# Patient Record
Sex: Female | Born: 1950 | Race: White | Hispanic: No | Marital: Married | Smoking: Never smoker
Health system: Southern US, Community
[De-identification: ages and names within clinical notes are randomized; demographics above are authoritative.]

## PROBLEM LIST (undated history)

## (undated) DIAGNOSIS — K760 Fatty (change of) liver, not elsewhere classified: Secondary | ICD-10-CM

## (undated) DIAGNOSIS — M5030 Other cervical disc degeneration, unspecified cervical region: Secondary | ICD-10-CM

## (undated) DIAGNOSIS — E079 Disorder of thyroid, unspecified: Secondary | ICD-10-CM

## (undated) DIAGNOSIS — F331 Major depressive disorder, recurrent, moderate: Secondary | ICD-10-CM

## (undated) DIAGNOSIS — IMO0002 Reserved for concepts with insufficient information to code with codable children: Secondary | ICD-10-CM

## (undated) DIAGNOSIS — K317 Polyp of stomach and duodenum: Secondary | ICD-10-CM

## (undated) DIAGNOSIS — F329 Major depressive disorder, single episode, unspecified: Secondary | ICD-10-CM

## (undated) DIAGNOSIS — G43909 Migraine, unspecified, not intractable, without status migrainosus: Secondary | ICD-10-CM

## (undated) DIAGNOSIS — F32A Depression, unspecified: Secondary | ICD-10-CM

## (undated) DIAGNOSIS — E559 Vitamin D deficiency, unspecified: Secondary | ICD-10-CM

## (undated) DIAGNOSIS — N200 Calculus of kidney: Secondary | ICD-10-CM

## (undated) DIAGNOSIS — K279 Peptic ulcer, site unspecified, unspecified as acute or chronic, without hemorrhage or perforation: Secondary | ICD-10-CM

## (undated) DIAGNOSIS — N390 Urinary tract infection, site not specified: Secondary | ICD-10-CM

## (undated) DIAGNOSIS — E890 Postprocedural hypothyroidism: Secondary | ICD-10-CM

## (undated) DIAGNOSIS — I10 Essential (primary) hypertension: Secondary | ICD-10-CM

## (undated) DIAGNOSIS — K219 Gastro-esophageal reflux disease without esophagitis: Secondary | ICD-10-CM

## (undated) DIAGNOSIS — R7989 Other specified abnormal findings of blood chemistry: Secondary | ICD-10-CM

## (undated) DIAGNOSIS — R079 Chest pain, unspecified: Secondary | ICD-10-CM

## (undated) DIAGNOSIS — F419 Anxiety disorder, unspecified: Secondary | ICD-10-CM

## (undated) DIAGNOSIS — J301 Allergic rhinitis due to pollen: Secondary | ICD-10-CM

## (undated) DIAGNOSIS — Z90710 Acquired absence of both cervix and uterus: Secondary | ICD-10-CM

## (undated) DIAGNOSIS — M8580 Other specified disorders of bone density and structure, unspecified site: Secondary | ICD-10-CM

## (undated) DIAGNOSIS — Z780 Asymptomatic menopausal state: Secondary | ICD-10-CM

## (undated) DIAGNOSIS — F411 Generalized anxiety disorder: Secondary | ICD-10-CM

## (undated) HISTORY — DX: Major depressive disorder, single episode, unspecified: F32.9

## (undated) HISTORY — DX: Other specified abnormal findings of blood chemistry: R79.89

## (undated) HISTORY — DX: Major depressive disorder, recurrent, moderate: F33.1

## (undated) HISTORY — DX: Vitamin D deficiency, unspecified: E55.9

## (undated) HISTORY — DX: Anxiety disorder, unspecified: F41.9

## (undated) HISTORY — DX: Calculus of kidney: N20.0

## (undated) HISTORY — DX: Allergic rhinitis due to pollen: J30.1

## (undated) HISTORY — DX: Fatty (change of) liver, not elsewhere classified: K76.0

## (undated) HISTORY — DX: Asymptomatic menopausal state: Z78.0

## (undated) HISTORY — DX: Chest pain, unspecified: R07.9

## (undated) HISTORY — DX: Acquired absence of both cervix and uterus: Z90.710

## (undated) HISTORY — DX: Reserved for concepts with insufficient information to code with codable children: IMO0002

## (undated) HISTORY — DX: Generalized anxiety disorder: F41.1

## (undated) HISTORY — DX: Postprocedural hypothyroidism: E89.0

## (undated) HISTORY — DX: Depression, unspecified: F32.A

## (undated) HISTORY — DX: Urinary tract infection, site not specified: N39.0

## (undated) HISTORY — DX: Migraine, unspecified, not intractable, without status migrainosus: G43.909

## (undated) HISTORY — PX: LITHOTRIPSY: SUR834

## (undated) HISTORY — DX: Other specified disorders of bone density and structure, unspecified site: M85.80

## (undated) HISTORY — DX: Other cervical disc degeneration, unspecified cervical region: M50.30

## (undated) HISTORY — DX: Peptic ulcer, site unspecified, unspecified as acute or chronic, without hemorrhage or perforation: K27.9

## (undated) HISTORY — PX: COSMETIC SURGERY: SHX468

## (undated) HISTORY — DX: Essential (primary) hypertension: I10

## (undated) HISTORY — DX: Disorder of thyroid, unspecified: E07.9

## (undated) HISTORY — DX: Polyp of stomach and duodenum: K31.7

## (undated) HISTORY — DX: Gastro-esophageal reflux disease without esophagitis: K21.9

---

## 1999-08-07 ENCOUNTER — Emergency Department (HOSPITAL_COMMUNITY): Admission: EM | Admit: 1999-08-07 | Discharge: 1999-08-07 | Payer: Self-pay

## 1999-08-09 ENCOUNTER — Emergency Department (HOSPITAL_COMMUNITY): Admission: EM | Admit: 1999-08-09 | Discharge: 1999-08-09 | Payer: Self-pay | Admitting: Emergency Medicine

## 2001-09-08 HISTORY — PX: ABDOMINAL HYSTERECTOMY: SHX81

## 2001-10-01 ENCOUNTER — Inpatient Hospital Stay (HOSPITAL_COMMUNITY): Admission: RE | Admit: 2001-10-01 | Discharge: 2001-10-03 | Payer: Self-pay | Admitting: Obstetrics and Gynecology

## 2001-10-01 ENCOUNTER — Encounter (INDEPENDENT_AMBULATORY_CARE_PROVIDER_SITE_OTHER): Payer: Self-pay | Admitting: Specialist

## 2002-04-04 ENCOUNTER — Encounter: Payer: Self-pay | Admitting: Urology

## 2002-04-04 ENCOUNTER — Ambulatory Visit (HOSPITAL_BASED_OUTPATIENT_CLINIC_OR_DEPARTMENT_OTHER): Admission: RE | Admit: 2002-04-04 | Discharge: 2002-04-04 | Payer: Self-pay | Admitting: Urology

## 2004-04-22 ENCOUNTER — Ambulatory Visit (HOSPITAL_COMMUNITY): Admission: RE | Admit: 2004-04-22 | Discharge: 2004-04-22 | Payer: Self-pay | Admitting: Gastroenterology

## 2005-11-08 HISTORY — PX: CHOLECYSTECTOMY: SHX55

## 2007-06-12 ENCOUNTER — Other Ambulatory Visit: Admission: RE | Admit: 2007-06-12 | Discharge: 2007-06-12 | Payer: Self-pay | Admitting: Family Medicine

## 2007-08-28 ENCOUNTER — Encounter: Admission: RE | Admit: 2007-08-28 | Discharge: 2007-08-28 | Payer: Self-pay | Admitting: Otolaryngology

## 2007-09-09 HISTORY — PX: BREAST SURGERY: SHX581

## 2007-12-17 ENCOUNTER — Encounter: Admission: RE | Admit: 2007-12-17 | Discharge: 2007-12-17 | Payer: Self-pay

## 2007-12-26 ENCOUNTER — Encounter (INDEPENDENT_AMBULATORY_CARE_PROVIDER_SITE_OTHER): Payer: Self-pay | Admitting: Interventional Radiology

## 2007-12-26 ENCOUNTER — Other Ambulatory Visit: Admission: RE | Admit: 2007-12-26 | Discharge: 2007-12-26 | Payer: Self-pay | Admitting: Interventional Radiology

## 2007-12-26 ENCOUNTER — Encounter: Admission: RE | Admit: 2007-12-26 | Discharge: 2007-12-26 | Payer: Self-pay | Admitting: Specialist

## 2008-08-27 ENCOUNTER — Ambulatory Visit: Payer: Self-pay | Admitting: Diagnostic Radiology

## 2008-08-27 ENCOUNTER — Emergency Department (HOSPITAL_BASED_OUTPATIENT_CLINIC_OR_DEPARTMENT_OTHER): Admission: EM | Admit: 2008-08-27 | Discharge: 2008-08-27 | Payer: Self-pay | Admitting: Emergency Medicine

## 2009-07-02 IMAGING — CT CT PARANASAL SINUSES LIMITED
1 series · 16 of 21 positions shown, 20 images · IV contrast (agent unspecified)
Comparison: none

CLINICAL DATA: Sinusitis.
 LIMITED CT OF THE PARANASAL SINUSES WITHOUT CONTRAST:
TECHNIQUE: Limited coronal CT images were obtained through the paranasal sinuses without intravenous contrast.
 Only mild mucosal thickening is noted in the left frontal sinus extending to the posterior ethmoid air cells. However, no air fluid level is seen.  The nasal turbinates on the left are slightly more prominent than those on the right and do compromise the left nasal airway slightly.  No bony abnormality is seen.  Multiple artifacts are created by dental fillings.

[Series 2: limited sinus prone · axial · 0.33mm/px · z∈[+14,+104]mm · 16 of 21 slices shown, 20 images]
[im 2/21  brain]
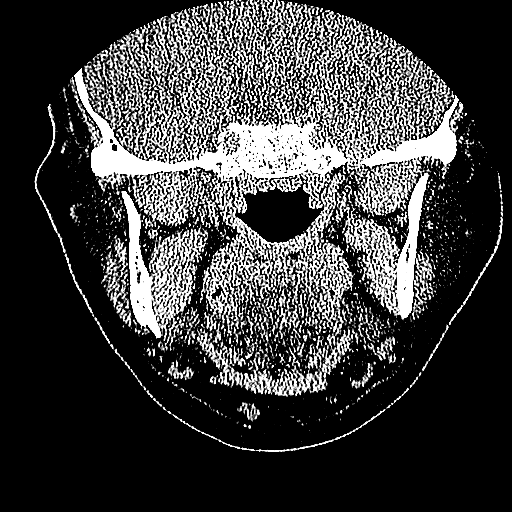
[im 2/21  bone]
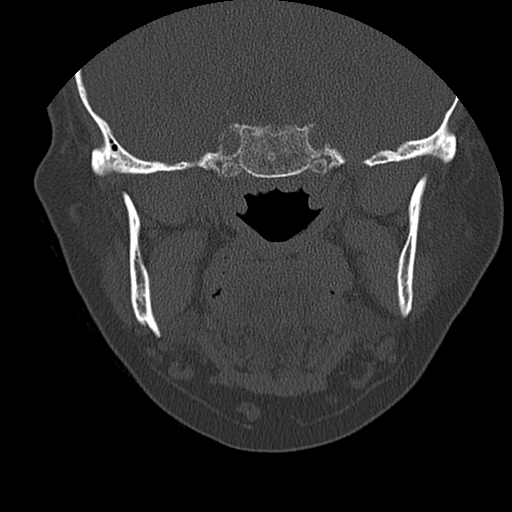
[im 3/21  bone]
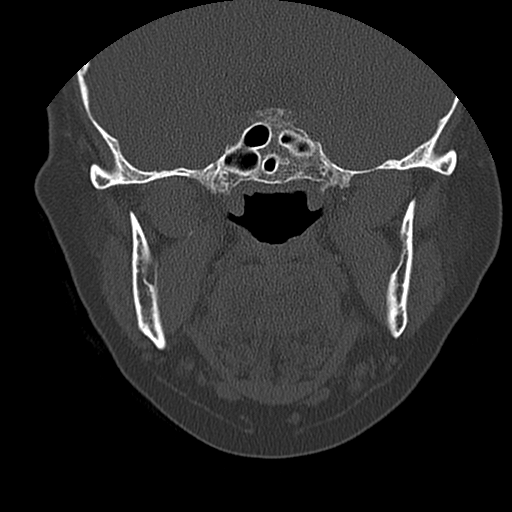
[im 4/21  bone]
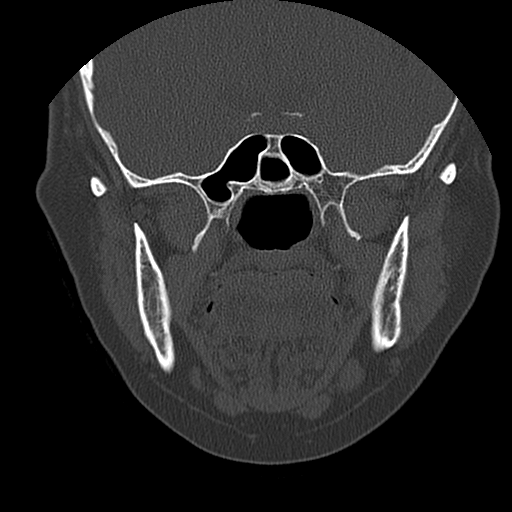
[im 6/21  bone]
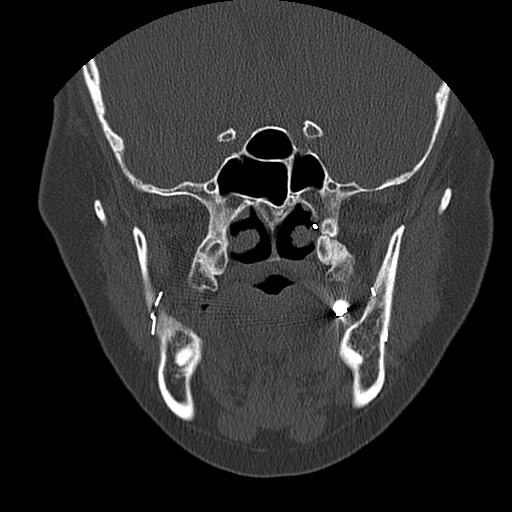
[im 7/21  brain]
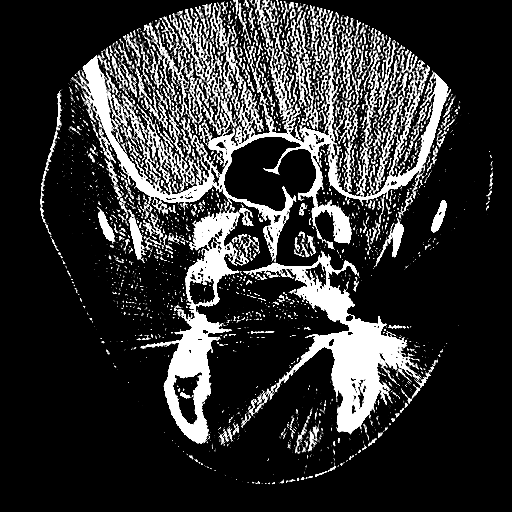
[im 7/21  bone]
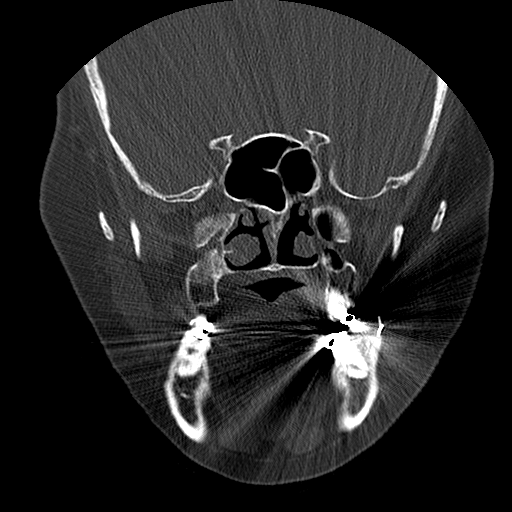
[im 8/21  bone]
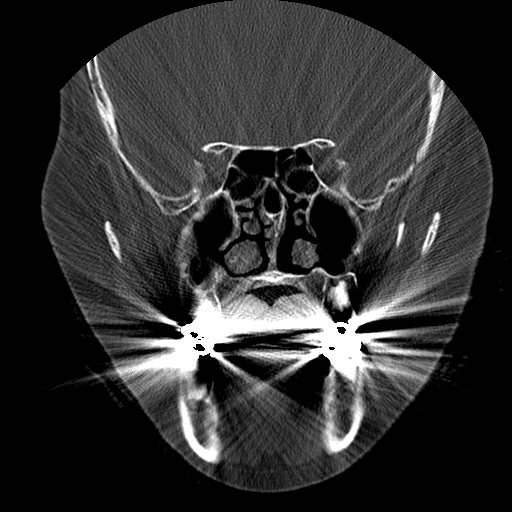
[im 9/21  bone]
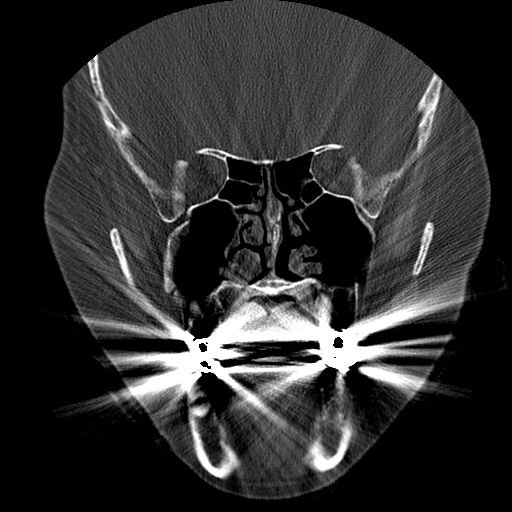
[im 10/21  bone]
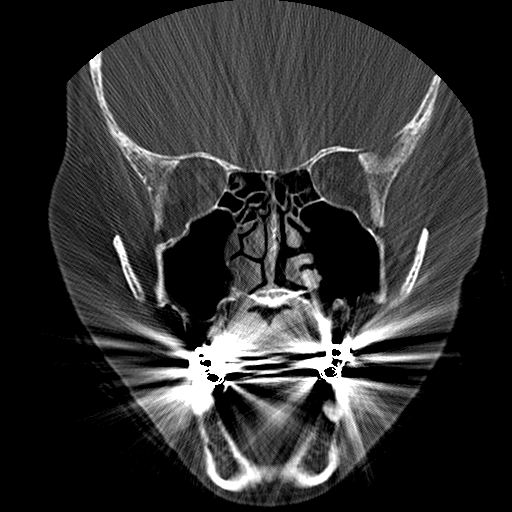
[im 12/21  brain]
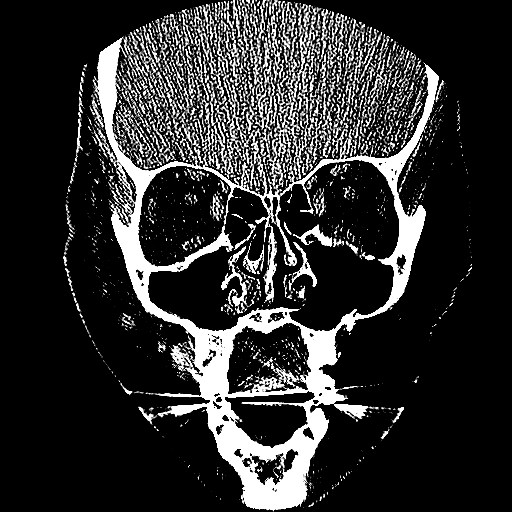
[im 12/21  bone]
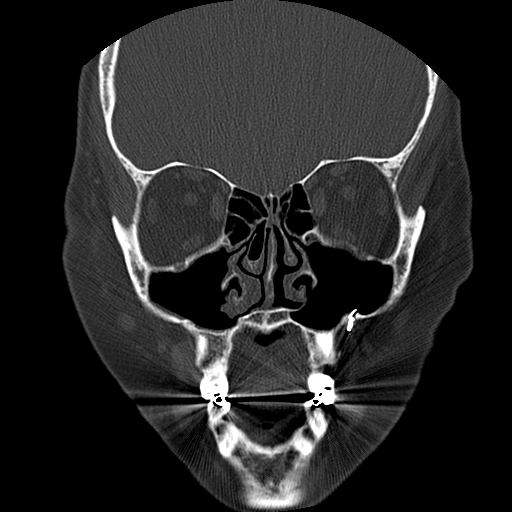
[im 13/21  bone]
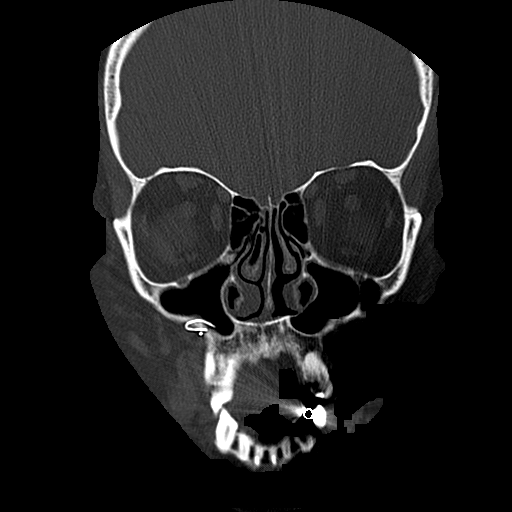
[im 14/21  bone]
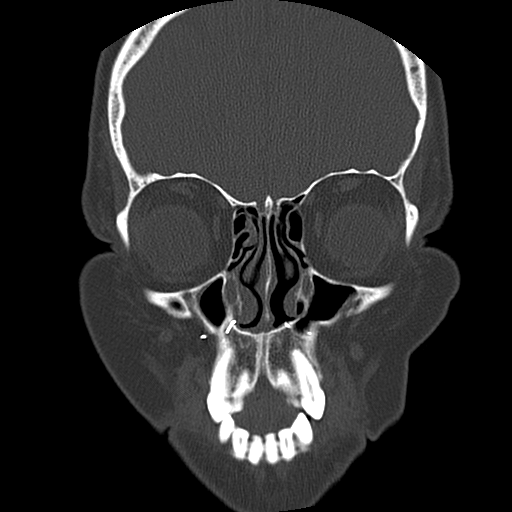
[im 15/21  bone]
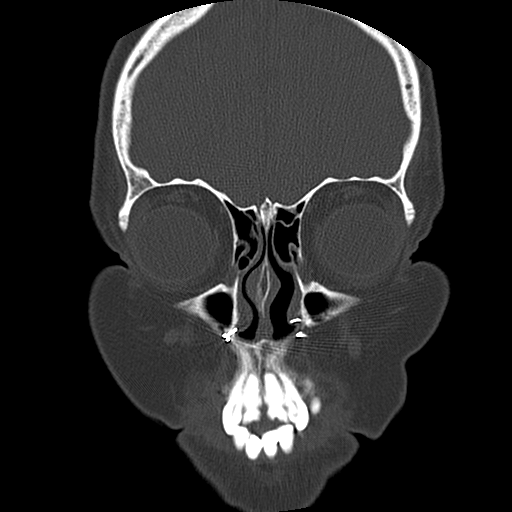
[im 16/21  brain]
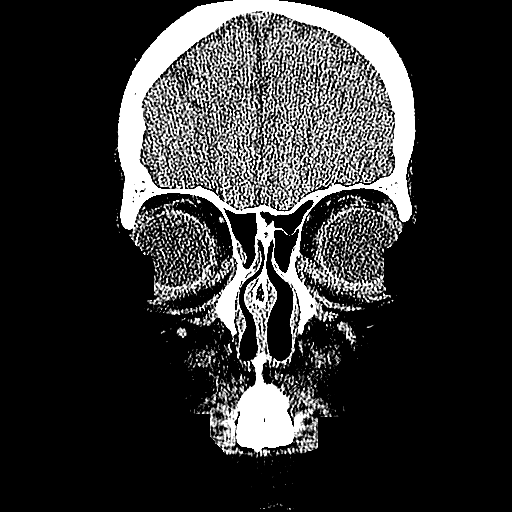
[im 16/21  bone]
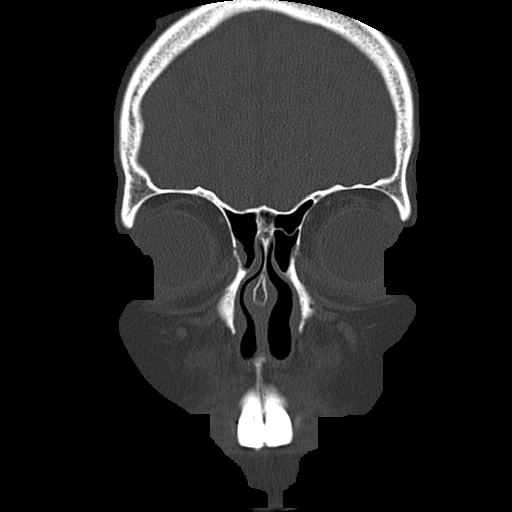
[im 18/21  bone]
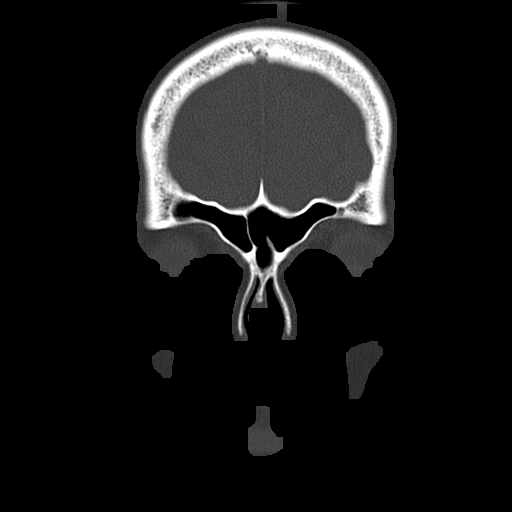
[im 19/21  bone]
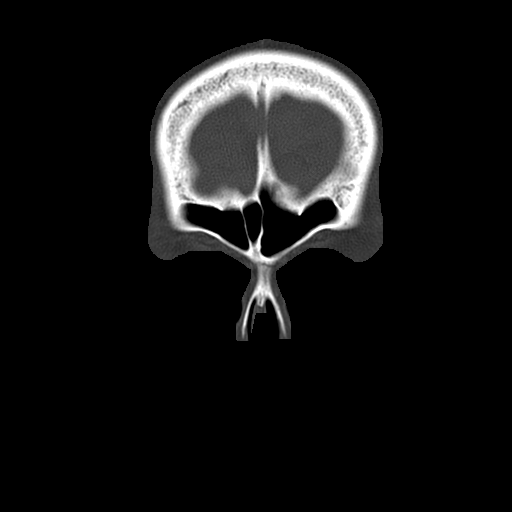
[im 20/21  bone]
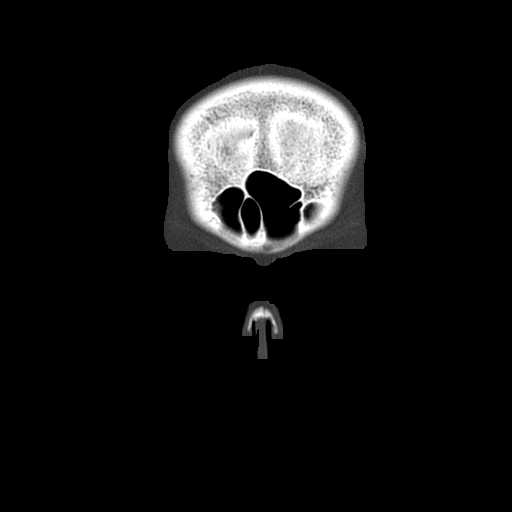

[16 of 21 positions shown; findings below may reference images not displayed]

IMPRESSION: No sinusitis.  Mild mucosal thickening in posterior ethmoid air cells and left frontal sinus.  Left nasal airway is somewhat narrowed by  more prominent turbinates.

## 2009-12-01 ENCOUNTER — Encounter: Admission: RE | Admit: 2009-12-01 | Discharge: 2009-12-01 | Payer: Self-pay | Admitting: Internal Medicine

## 2009-12-01 ENCOUNTER — Other Ambulatory Visit: Admission: RE | Admit: 2009-12-01 | Discharge: 2009-12-01 | Payer: Self-pay | Admitting: Diagnostic Radiology

## 2009-12-09 HISTORY — PX: TOTAL THYROIDECTOMY: SHX2547

## 2010-08-01 ENCOUNTER — Encounter: Payer: Self-pay | Admitting: Internal Medicine

## 2010-10-26 LAB — CBC
MCHC: 33.3 g/dL (ref 30.0–36.0)
MCV: 80.8 fL (ref 78.0–100.0)
Platelets: 402 10*3/uL — ABNORMAL HIGH (ref 150–400)
RBC: 5.31 MIL/uL — ABNORMAL HIGH (ref 3.87–5.11)
WBC: 10.6 10*3/uL — ABNORMAL HIGH (ref 4.0–10.5)

## 2010-10-26 LAB — DIFFERENTIAL
Basophils Relative: 0 % (ref 0–1)
Eosinophils Absolute: 0.2 10*3/uL (ref 0.0–0.7)
Lymphs Abs: 1.9 10*3/uL (ref 0.7–4.0)
Neutro Abs: 8.2 10*3/uL — ABNORMAL HIGH (ref 1.7–7.7)
Neutrophils Relative %: 76 % (ref 43–77)

## 2010-10-26 LAB — BASIC METABOLIC PANEL
BUN: 14 mg/dL (ref 6–23)
Calcium: 9.5 mg/dL (ref 8.4–10.5)
Chloride: 101 mEq/L (ref 96–112)
Creatinine, Ser: 0.8 mg/dL (ref 0.4–1.2)
GFR calc Af Amer: >60 mL/min (ref >60)

## 2010-11-26 NOTE — Op Note (Signed)
[facility]  Patient:    Kayla, Ferguson Visit Number: [id number] MRN: [id number]          Service Type: GYN Location: 9300 [id number] Attending Physician:  Brynda Peon Dictated by:   Edwena Felty. Ashley Royalty, M.D. Proc. Date: 10/01/01 Admit Date:  10/01/2001                             Operative Report  PREOPERATIVE DIAGNOSIS:       1. Menorrhagia.                               2. Fibroids.                               3. Unresponsive to medical management.  POSTOPERATIVE DIAGNOSIS:      1. Menorrhagia.                               2. Fibroids.                               3. Unresponsive to medical management.                               4. Endometriosis.  OPERATION:                    Total abdominal hysterectomy and bilateral salpingo-oophorectomy.  SURGEON:                      Cynthia P. Ashley Royalty, M.D.  ASSISTANT:                    Andres Ege, M.D.  ANESTHESIA:                   General endotracheal.  ESTIMATED BLOOD LOSS:         300 cc.  COMPLICATIONS:                None.  FINDINGS:                     Upon entering the abdomen the uterus was noted to be slightly enlarged and had several areas of serosal powder burn-type endometriosis over the surface of the uterus.  The ovary on the right was densely adherent to the posterior leafs of the broad ligament and continued a small endometrioma.  The ovary on the left was mobile, but also contained a small approximately 1 cm endometrioma.  DESCRIPTION OF PROCEDURE:     The patient was taken to the operating room and after the induction of adequate general endotracheal anesthesia was prepped and draped in the usual fashion. A Foley catheter inserted.  Pfannenstiel incision was made at the site of the previous Pfannenstiel.  The incision was carried down to the fascia using the Bovie.  The fascia was nicked and opened transversely.  Kochers were used to grasp the  fascial margins, that were separated with the underlying rectus muscle with sharp dissection.  The rectus muscles were separated sharply. The underlying peritoneum was elevated between hemostats and entered atraumatically.  The peritoneum was opened vertically. The abdomen was explored; there was noted to be chocolate-colored fluid over the omentum and intestines as the abdomen was entered, and endometriosis was noted on the surface of the uterus and the ovaries bilaterally (as noted above).  The upper abdomen was normal.  The gallbladder was status post removal; there was no adenopathy.  The Balfour retractor was placed.  The uterus was clamped at the cornu with Kelly clamps.  __________________ and cut bilaterally.  The anterior leaf of the broad ligament was taken down sharply. The infundibulopelvic abdominal ligament was skeletonized, clamped and doubly ligated on both sides with 0 chromic.  The right ovary had to be dissected free off the posterior leaf of the broad ligament before this could be accomplished.  The uterine arteries were skeletonized, clamped cut and doubly tied.  The cardinal ligament was clamped, cut and tied in sequence right down to the angle of the cervix, where the vagina was entered and the uterus was removed with __________ scissors.  The vaginal margin was grasped with Kochers.  Angle sutures were placed on the right and left angles, and the vaginal cuff was closed with figure-of-eight sutures with 0 chromic.  Good hemostasis was noted.  The pelvis was irrigated. There was noted to be an approximately 1 cm nodule of ovarian tissue left on a peritoneal margin, up by the infundibulopelvic ligament.  This was excised sharply and the peritoneum was closed with 2-0 chromic for hemostasis.  Again, the abdomen was irrigated.  Hemostasis was noted.  The packs were removed.  The bowels were allowed to return to anatomic position.  The peritoneum was closed with 0  chromic in a running fashion.  The fascia was closed in a running fashion using 0 Vicryl going from each angle towards the midline.  Hemostasis was achieved in subcutaneous tissue with the Bovie, and the skin was closed with staples.  The patient tolerated the procedure well; went in satisfactory condition to post-anesthesia recovery. Dictated by:   Edwena Felty. Ashley Royalty, M.D. Attending Physician:  Brynda Peon DD:  10/01/01 TD:  10/02/01 Job: [id number]

## 2010-11-26 NOTE — Discharge Summary (Signed)
[facility]  Patient:    Kayla Ferguson, Kayla Ferguson Visit Number: [id number] MRN: [id number]          Service Type: GYN Location: 9300 [id number] Attending Physician:  Brynda Peon Dictated by:   Edwena Felty. Ashley Royalty, M.D. Admit Date:  10/01/2001 Discharge Date: 10/03/2001                             Discharge Summary  DISCHARGE DIAGNOSES: 1. Menorrhagia. 2. Leiomyomata. 3. Endometriosis involving the uterus and ovaries bilaterally.  HISTORY OF PRESENT ILLNESS:  This is a 60 year old woman who had known menorrhagia and uterine fibroids who was admitted for a total abdominal hysterectomy and bilateral salpingo-oophorectomy.  On 10/01/01, she underwent total abdominal hysterectomy and bilateral salpingo-oophorectomy under general endotracheal anesthesia.  Estimated blood loss was 300 cc.  There were no complications.  The patient had an uneventful postoperative course, and was sent home on her second postoperative day, afebrile, and in good condition.  DISCHARGE MEDICATIONS: 1. Percocet 5 mg #25 to take one p.o. q.4h. 2. Climara patches 0.1 mg one box with one refill to apply weekly.  FOLLOWUP:  Come back to the office for followup in six weeks.  ACTIVITY:  She is given instructions regarding pelvic rest, no heavy lifting.  Pathology report did confirm an endometrial polyp, leiomyomata that were both intramural and subserosal.  Uterine serosal endometriosis, and endometriosis on both right and left ovaries.  LABORATORY DATA:  Admission hemoglobin and hematocrit were 12 and 36, on discharge, 9 and 27.9.  Blood type was O+. Dictated by:   Edwena Felty. Ashley Royalty, M.D. Attending Physician:  Brynda Peon DD:  11/07/01 TD:  11/08/01 Job: [id number]

## 2010-11-26 NOTE — Op Note (Signed)
NAME:  Kayla Ferguson, Kayla Ferguson NO.:  [id number]   MEDICAL RECORD NO.:  [id number]          PATIENT TYPE:  AMB   LOCATION:  ENDO                         FACILITY:  [facility]   PHYSICIAN:  Graylin Shiver, M.D.   DATE OF BIRTH:  [birth year]   DATE OF PROCEDURE:  04/22/2004  DATE OF DISCHARGE:                                 OPERATIVE REPORT   PROCEDURE:  Colonoscopy.   INDICATIONS:  Screening.   Informed consent was obtained after explanation of the risks of bleeding,  infection, and perforation.   PREMEDICATION:  Fentanyl 75 mcg IV, Versed 7.5 mg IV.   PROCEDURE:  With the patient in the left lateral decubitus position, a  rectal exam was performed, no masses were felt.  The Olympus colonoscope was  inserted into the rectum and advanced around the colon to the cecum.  Cecal  landmarks were identified.  The cecum and ascending colon were normal, the  transverse colon normal, the descending colon, sigmoid, and rectum were  normal.  She tolerated the procedure well without complications.   IMPRESSION:  Normal colonoscopy to the cecum.   I would recommend a follow-up screening colonoscopy again in 10 years.       SFG/MEDQ  D:  04/22/2004  T:  04/22/2004  Job:  [id number]   cc:   Elana Alm. Nicholos Johns, M.D.  [address]  [place]  [place] [address]  Fax: [phone]

## 2011-03-22 ENCOUNTER — Ambulatory Visit (INDEPENDENT_AMBULATORY_CARE_PROVIDER_SITE_OTHER): Payer: BC Managed Care – PPO | Admitting: Internal Medicine

## 2011-03-22 ENCOUNTER — Encounter: Payer: Self-pay | Admitting: Internal Medicine

## 2011-03-22 VITALS — BP 132/80 | HR 108 | Temp 97.2°F | Ht 65.5 in | Wt 206.0 lb

## 2011-03-22 DIAGNOSIS — N951 Menopausal and female climacteric states: Secondary | ICD-10-CM

## 2011-03-22 DIAGNOSIS — Z780 Asymptomatic menopausal state: Secondary | ICD-10-CM

## 2011-03-22 DIAGNOSIS — E079 Disorder of thyroid, unspecified: Secondary | ICD-10-CM | POA: Insufficient documentation

## 2011-03-22 DIAGNOSIS — F329 Major depressive disorder, single episode, unspecified: Secondary | ICD-10-CM | POA: Insufficient documentation

## 2011-03-22 DIAGNOSIS — K760 Fatty (change of) liver, not elsewhere classified: Secondary | ICD-10-CM | POA: Insufficient documentation

## 2011-03-22 DIAGNOSIS — F419 Anxiety disorder, unspecified: Secondary | ICD-10-CM | POA: Insufficient documentation

## 2011-03-22 DIAGNOSIS — N200 Calculus of kidney: Secondary | ICD-10-CM | POA: Insufficient documentation

## 2011-03-22 DIAGNOSIS — E039 Hypothyroidism, unspecified: Secondary | ICD-10-CM

## 2011-03-22 DIAGNOSIS — I10 Essential (primary) hypertension: Secondary | ICD-10-CM

## 2011-03-22 LAB — TSH: TSH: 0.322 u[IU]/mL — ABNORMAL LOW (ref 0.350–4.500)

## 2011-03-22 NOTE — Patient Instructions (Signed)
Will have labs mailed to you.  Once cholesterol  Is checked,  Will calculate Framingham risk score,  If less that 10% will initiate Premarin.  If any swelling in one leg with pain,  Call immediately  See me in 2-3 months.

## 2011-03-22 NOTE — Progress Notes (Signed)
Subjective:    Patient ID: Kayla Ferguson, female    DOB: [birth year], 60 y.o.   MRN: [id number]  HPI New pt here for first visit. Primary care Arnette Felts PA with [facility].  Self referred to discuss menopausal symtpoms.   PMH of HTN,  Hypothyroidism, Depression, Migraine headache, GERD, Osteopenia,  Anxiety, GERD, bilateral renal stones, stomach polyps, and fatty liver.  She reports she has multiple daily hot flushes and most nights.  She has difficulty sleeping due to flushes and also due to the stress she is under.  She reports some vaginal dryness. She has been on Cymbalta for the last 6 years but wants to try HT>  She was on Premarin for a short time in 2003 when she had her hysterectomy for fibroids and bilateral oopherectomy.  There is no personal of FH of DVT or PE, or clotting disorder.  Mother had HTN and a CVA.  No personal or FH of breast cancer, she reports that paternal GM had some sort of GYN cancer, but not breast.  Pts HTN is well controlled and she has never smoked.  Her last mammogram in 7/23 was reported as normal.   Allergies  Allergen Reactions  . Sulfa Antibiotics Nausea And Vomiting   Past Medical History  Diagnosis Date  . Thyroid disease   . Hypertension   . Depression   . Menopause   . Migraine   . Anxiety   . GERD (gastroesophageal reflux disease)   . Osteopenia    Past Surgical History  Procedure Date  . Abdominal hysterectomy 3/03  . Breast surgery 3/09    breast reduction  . Cosmetic surgery   . Total thyroidectomy 12/2009  . Cholecystectomy 5/07  . Lithotripsy 2007, 2010   History   Social History  . Marital Status: Married    Spouse Name: N/A    Number of Children: N/A  . Years of Education: N/A   Occupational History  . Not on file.   Social History Main Topics  . Smoking status: Never Smoker   . Smokeless tobacco: Never Used  . Alcohol Use: Yes     socially  . Drug Use: No  . Sexually Active: Yes    Birth Control/ Protection:  Post-menopausal   Other Topics Concern  . Not on file   Social History Narrative  . No narrative on file   Family History  Problem Relation Age of Onset  . Hypertension Mother   . Thyroid disease Mother   . Kidney disease Father   . Fibromyalgia Sister   . Lung disease Sister     pulmonary fibrosis  . Breast cancer Maternal Aunt   . Heart disease Maternal Grandfather    There is no problem list on file for this patient.  No current outpatient prescriptions on file prior to visit.      Review of Systems    See HPI Objective:   Physical Exam  Physical Exam  Nursing note and vitals reviewed.  Constitutional: She is oriented to person, place, and time. She appears well-developed and well-nourished.  HENT:  Head: Normocephalic and atraumatic.  Cardiovascular: Normal rate and regular rhythm. Exam reveals no gallop and no friction rub.  No murmur heard.  Pulmonary/Chest: Breath sounds normal. She has no wheezes. She has no rales.  Neurological: She is alert and oriented to person, place, and time.  Skin: Skin is warm and dry.  Psychiatric: She has a normal mood and affect. Her behavior is normal.  Assessment & Plan:  1)  Symptomatic Menopause:    Will get copy of last mammogram and check labs with cholesterol and TSH today.  Once Framingham risk is calculated, if less than 10% will initiate transdermal  Vivelle dot. .05 mg daily to be changed twice weekly.  Extensive discussion with pt regarding risk/benefits and she has signed risk/benefit educational handout.   She is to return to see me in 3 months.  Counseled if any worsening swelling in one or both legs or calf or thigh pain, she is to stop med and notify my office immediately .  She voices understanding.  She was also counseled that in a few cases, migraines can worsen and she is to moniter this and notify me if increased frequency.   I spent 45 minutes with this pt.  2)  Mild LE edema:   Advised to discuss  with primary provider 3)  H/O migraines see above 4)  HTN  Well controlled 5)   Bilateral renal calculi 6)  GERD 7)  Hypothyroidism 8)  H/O stomach polyps  Dr. Noe Gens following

## 2011-03-23 ENCOUNTER — Telehealth: Payer: Self-pay | Admitting: Internal Medicine

## 2011-03-23 ENCOUNTER — Encounter: Payer: Self-pay | Admitting: Emergency Medicine

## 2011-03-23 DIAGNOSIS — Z780 Asymptomatic menopausal state: Secondary | ICD-10-CM

## 2011-03-23 LAB — COMPREHENSIVE METABOLIC PANEL
AST: 14 U/L (ref 0–37)
Albumin: 3.7 g/dL (ref 3.5–5.2)
Alkaline Phosphatase: 124 U/L — ABNORMAL HIGH (ref 39–117)
Calcium: 8.6 mg/dL (ref 8.4–10.5)
Chloride: 102 mEq/L (ref 96–112)
Potassium: 3.7 mEq/L (ref 3.5–5.3)
Sodium: 139 mEq/L (ref 135–145)
Total Protein: 6.7 g/dL (ref 6.0–8.3)

## 2011-03-23 LAB — LIPID PANEL: LDL Cholesterol: 134 mg/dL — ABNORMAL HIGH (ref 0–99)

## 2011-03-23 MED ORDER — ESTRADIOL 0.05 MG/24HR TD PTTW: 1.0000 | MEDICATED_PATCH | TRANSDERMAL | Status: DC

## 2011-03-23 NOTE — Telephone Encounter (Signed)
Spoke with pt 11:50 am.  Framingham risk score 9.8%  Pt aware.  Will give Vivelle dot .05 mg for a shorter period 2-3 years and then substitue non hormonal options for flushes.  Pt is agreeable.  I also counseled her that she need to have fasting glucose and recheck TSH and alk phos with her primary provider.  Labs will be mailed to her.

## 2011-03-23 NOTE — Progress Notes (Signed)
  Subjective:    Patient ID: Kayla Ferguson, female    DOB: 1951-05-26, 60 y.o.   MRN: [id number]  HPI    Review of Systems     Objective:   Physical Exam        Assessment & Plan:  Addendum:  Framingham risk score calculated at 9.8%.  Will discuss with pt and OK to start Vivelle dot  .05 mg for a short period 2-3 years.

## 2011-05-24 ENCOUNTER — Ambulatory Visit: Payer: BC Managed Care – PPO | Admitting: Internal Medicine

## 2011-08-26 ENCOUNTER — Encounter: Payer: Self-pay | Admitting: Internal Medicine

## 2011-11-23 ENCOUNTER — Ambulatory Visit: Payer: BC Managed Care – PPO | Admitting: Internal Medicine

## 2012-01-13 DIAGNOSIS — Z90710 Acquired absence of both cervix and uterus: Secondary | ICD-10-CM | POA: Insufficient documentation

## 2012-01-13 HISTORY — DX: Acquired absence of both cervix and uterus: Z90.710

## 2012-01-17 ENCOUNTER — Ambulatory Visit (INDEPENDENT_AMBULATORY_CARE_PROVIDER_SITE_OTHER): Payer: BC Managed Care – PPO | Admitting: Internal Medicine

## 2012-01-17 ENCOUNTER — Encounter: Payer: Self-pay | Admitting: Internal Medicine

## 2012-01-17 VITALS — BP 136/76 | HR 118 | Temp 97.4°F | Resp 16 | Ht 66.0 in | Wt 189.0 lb

## 2012-01-17 DIAGNOSIS — Z90710 Acquired absence of both cervix and uterus: Secondary | ICD-10-CM

## 2012-01-17 NOTE — Progress Notes (Signed)
Subjective:    Patient ID: Kayla Ferguson, female    DOB: [birth year], 61 y.o.   MRN: [id number]  HPI  Kayla Ferguson is here for follow up.  She reports she has stopped Cymbalta as she did not like being on meds.  She has noted that her night and daytime sweating has stopped since stopping cymbalta.  She has been off Cymbalta for 2-3 months  She also came off her estradiol as she thought it was making her swell.  She is under considerable stress.  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  Family member suffers from addiction, she is very worried about finances right now.  Not sleeping very well.  Some sadness but does not want to take anti-depressant and states cannot affort psychiatric eval.  She denies suicidal thoughts or psychotic features.  Has been on Zoloft, wellburtrin and multiple other anti-depressants.    Allergies  Allergen Reactions  . Sulfa Antibiotics Nausea And Vomiting   Past Medical History  Diagnosis Date  . Menopause   . Osteopenia   . Anxiety   . Depression   . GERD (gastroesophageal reflux disease)   . Hypertension   . Thyroid disease   . Migraine   . Renal calculi     bilateral  . Gastric polyps   . Fatty liver    Past Surgical History  Procedure Date  . Abdominal hysterectomy 3/03  . Breast surgery 3/09    breast reduction  . Cosmetic surgery   . Total thyroidectomy 12/2009  . Cholecystectomy 5/07  . Lithotripsy 2007, 2010   History   Social History  . Marital Status: Married    Spouse Name: N/A    Number of Children: N/A  . Years of Education: N/A   Occupational History  . Not on file.   Social History Main Topics  . Smoking status: Never Smoker   . Smokeless tobacco: Never Used  . Alcohol Use: Yes     socially  . Drug Use: No  . Sexually Active: Yes    Birth Control/ Protection: Post-menopausal   Other Topics Concern  . Not on file   Social History Narrative  . No narrative on file   Family History  Problem Relation Age of Onset  .  Hypertension Mother   . Thyroid disease Mother   . Kidney disease Father   . Fibromyalgia Sister   . Lung disease Sister     pulmonary fibrosis  . Breast cancer Maternal Aunt   . Heart disease Maternal Grandfather    Patient Active Problem List  Diagnosis  . Anxiety  . Depression  . GERD (gastroesophageal reflux disease)  . Hypertension  . Thyroid disease  . Menopause  . Migraine  . Osteopenia  . Renal calculi  . Gastric polyps  . Fatty liver  . S/P hysterectomy   Current Outpatient Prescriptions on File Prior to Visit  Medication Sig Dispense Refill  . Cholecalciferol (VITAMIN D3) 2000 UNITS TABS Take 1 tablet by mouth daily.        Marland Kitchen DIOVAN 160 MG tablet       . levothyroxine (SYNTHROID, LEVOTHROID) 137 MCG tablet Take 1 tablet by mouth Daily.      Marland Kitchen omeprazole-sodium bicarbonate (ZEGERID) 40-1100 MG per capsule Take 1 capsule by mouth Daily.      Marland Kitchen oxyCODONE (OXYCONTIN) 20 MG 12 hr tablet Take 10 mg by mouth every 12 (twelve) hours as needed.       . phenazopyridine (PYRIDIUM) 200 MG tablet  Take 200 mg by mouth 3 (three) times daily as needed.        . Probiotic Product (PROBIOTIC FORMULA) CAPS Take 1 capsule by mouth at bedtime.        . vitamin B-12 (CYANOCOBALAMIN) 1000 MCG tablet Take 1,000 mcg by mouth daily.        Marland Kitchen estradiol (VIVELLE-DOT) 0.05 MG/24HR Place 1 patch (0.05 mg total) onto the skin 2 (two) times a week.  8 patch  11  . LORazepam (ATIVAN) 0.5 MG tablet Take 1 tablet by mouth Three times daily as needed.      . OMEGA 3 1000 MG CAPS Take 2 capsules by mouth daily.            Review of Systems    see HPI Objective:   Physical Exam Physical Exam  Nursing note and vitals reviewed.  Constitutional: She is oriented to person, place, and time. She appears well-developed and well-nourished.  HENT:  Head: Normocephalic and atraumatic.  Cardiovascular: Normal rate and regular rhythm. Exam reveals no gallop and no friction rub.  No murmur heard.    Pulmonary/Chest: Breath sounds normal. She has no wheezes. She has no rales.  Neurological: She is alert and oriented to person, place, and time.  Skin: Skin is warm and dry.  Psychiatric: She has a normal mood and affect. Her behavior is normal.        Assessment & Plan:  Situational  Stress  I gave number to Jeannie Done and Vonna Kotyk  For talking therapy.  She repeatedly does not want meds or psychatric eval.    Sweating  Improved now off Cymbalta  Htn well controlled  sittuational depression see above

## 2012-05-05 ENCOUNTER — Emergency Department (HOSPITAL_BASED_OUTPATIENT_CLINIC_OR_DEPARTMENT_OTHER): Payer: BC Managed Care – PPO

## 2012-05-05 ENCOUNTER — Encounter (HOSPITAL_BASED_OUTPATIENT_CLINIC_OR_DEPARTMENT_OTHER): Payer: Self-pay | Admitting: *Deleted

## 2012-05-05 ENCOUNTER — Emergency Department (HOSPITAL_BASED_OUTPATIENT_CLINIC_OR_DEPARTMENT_OTHER)
Admission: EM | Admit: 2012-05-05 | Discharge: 2012-05-05 | Disposition: A | Discharge status: Home / Self Care | Payer: BC Managed Care – PPO | Attending: Emergency Medicine | Admitting: Emergency Medicine

## 2012-05-05 DIAGNOSIS — F329 Major depressive disorder, single episode, unspecified: Secondary | ICD-10-CM | POA: Insufficient documentation

## 2012-05-05 DIAGNOSIS — M949 Disorder of cartilage, unspecified: Secondary | ICD-10-CM | POA: Insufficient documentation

## 2012-05-05 DIAGNOSIS — Z8639 Personal history of other endocrine, nutritional and metabolic disease: Secondary | ICD-10-CM | POA: Insufficient documentation

## 2012-05-05 DIAGNOSIS — F3289 Other specified depressive episodes: Secondary | ICD-10-CM | POA: Insufficient documentation

## 2012-05-05 DIAGNOSIS — M899 Disorder of bone, unspecified: Secondary | ICD-10-CM | POA: Insufficient documentation

## 2012-05-05 DIAGNOSIS — Z8719 Personal history of other diseases of the digestive system: Secondary | ICD-10-CM | POA: Insufficient documentation

## 2012-05-05 DIAGNOSIS — M79603 Pain in arm, unspecified: Secondary | ICD-10-CM

## 2012-05-05 DIAGNOSIS — R0789 Other chest pain: Secondary | ICD-10-CM | POA: Insufficient documentation

## 2012-05-05 DIAGNOSIS — F411 Generalized anxiety disorder: Secondary | ICD-10-CM | POA: Insufficient documentation

## 2012-05-05 DIAGNOSIS — E079 Disorder of thyroid, unspecified: Secondary | ICD-10-CM | POA: Insufficient documentation

## 2012-05-05 DIAGNOSIS — M79609 Pain in unspecified limb: Secondary | ICD-10-CM | POA: Insufficient documentation

## 2012-05-05 DIAGNOSIS — Z8669 Personal history of other diseases of the nervous system and sense organs: Secondary | ICD-10-CM | POA: Insufficient documentation

## 2012-05-05 DIAGNOSIS — I10 Essential (primary) hypertension: Secondary | ICD-10-CM | POA: Insufficient documentation

## 2012-05-05 DIAGNOSIS — Z79899 Other long term (current) drug therapy: Secondary | ICD-10-CM | POA: Insufficient documentation

## 2012-05-05 DIAGNOSIS — Z791 Long term (current) use of non-steroidal anti-inflammatories (NSAID): Secondary | ICD-10-CM | POA: Insufficient documentation

## 2012-05-05 DIAGNOSIS — Z862 Personal history of diseases of the blood and blood-forming organs and certain disorders involving the immune mechanism: Secondary | ICD-10-CM | POA: Insufficient documentation

## 2012-05-05 LAB — COMPREHENSIVE METABOLIC PANEL
Albumin: 3.4 g/dL — ABNORMAL LOW (ref 3.5–5.2)
Alkaline Phosphatase: 103 U/L (ref 39–117)
BUN: 13 mg/dL (ref 6–23)
CO2: 25 mEq/L (ref 19–32)
Chloride: 103 mEq/L (ref 96–112)
Glucose, Bld: 99 mg/dL (ref 70–99)
Potassium: 3.1 mEq/L — ABNORMAL LOW (ref 3.5–5.1)
Total Bilirubin: 0.1 mg/dL — ABNORMAL LOW (ref 0.3–1.2)

## 2012-05-05 LAB — CBC WITH DIFFERENTIAL/PLATELET
Basophils Relative: 0 % (ref 0–1)
Hemoglobin: 11.7 g/dL — ABNORMAL LOW (ref 12.0–15.0)
Lymphocytes Relative: 37 % (ref 12–46)
Lymphs Abs: 3.9 10*3/uL (ref 0.7–4.0)
Monocytes Relative: 7 % (ref 3–12)
Neutro Abs: 5.6 10*3/uL (ref 1.7–7.7)
Neutrophils Relative %: 54 % (ref 43–77)
RBC: 4.6 MIL/uL (ref 3.87–5.11)
WBC: 10.5 10*3/uL (ref 4.0–10.5)

## 2012-05-05 LAB — TROPONIN I
Troponin I: <0.3 ng/mL (ref <0.30)
Troponin I: <0.3 ng/mL (ref <0.30)

## 2012-05-05 MED ORDER — HYDROCODONE-ACETAMINOPHEN 5-500 MG PO TABS: 1.0000 | ORAL_TABLET | Freq: Four times a day (QID) | ORAL | Status: DC | PRN

## 2012-05-05 MED ORDER — KETOROLAC TROMETHAMINE 60 MG/2ML IM SOLN
60.0000 mg | Freq: Once | INTRAMUSCULAR | Status: AC
  Administered 2012-05-05: 60 mg via INTRAMUSCULAR
  Filled 2012-05-05: qty 2

## 2012-05-05 MED ORDER — IBUPROFEN 600 MG PO TABS: 600.0000 mg | ORAL_TABLET | Freq: Four times a day (QID) | ORAL | Status: DC | PRN

## 2012-05-05 NOTE — ED Provider Notes (Signed)
History     CSN: [id number]  Arrival date & time 05/05/12  0413   First MD Initiated Contact with Patient 05/05/12 0444      Chief Complaint  Patient presents with  . Arm Pain    (Consider location/radiation/quality/duration/timing/severity/associated sxs/prior treatment) Patient is a 61 y.o. female presenting with arm pain. The history is provided by the patient.  Arm Pain This is a new problem. The current episode started yesterday. The problem occurs constantly. The problem has been gradually improving. Pertinent negatives include no chest pain, no abdominal pain, no headaches and no shortness of breath. Associated symptoms comments: Had an episode of chest tightness lasting much less than a minute. Nothing aggravates the symptoms. The symptoms are relieved by NSAIDs. Treatments tried: nsaids. The treatment provided significant relief.  Awoke yesterday with a throbbing pain in the left hand.  Took some NSAIDs and it improved but at work pain came back in the hand and then was in the majority of the arm but not into the shoulder.  No n/v/d.  No DOE.  No SOB.  Had one episode of chest tightness during the course of the pain at meal time that lasted less than a minute.  Has had a negative stress test in the past  Past Medical History  Diagnosis Date  . Menopause   . Osteopenia   . Anxiety   . Depression   . GERD (gastroesophageal reflux disease)   . Hypertension   . Thyroid disease   . Migraine   . Renal calculi     bilateral  . Gastric polyps   . Fatty liver     Past Surgical History  Procedure Date  . Abdominal hysterectomy 3/03  . Breast surgery 3/09    breast reduction  . Cosmetic surgery   . Total thyroidectomy 12/2009  . Cholecystectomy 5/07  . Lithotripsy 2007, 2010    Family History  Problem Relation Age of Onset  . Hypertension Mother   . Thyroid disease Mother   . Kidney disease Father   . Fibromyalgia Sister   . Lung disease Sister     pulmonary  fibrosis  . Breast cancer Maternal Aunt   . Heart disease Maternal Grandfather     History  Substance Use Topics  . Smoking status: Never Smoker   . Smokeless tobacco: Never Used  . Alcohol Use: Yes     socially    OB History    Grav Para Term Preterm Abortions TAB SAB Ect Mult Living                  Review of Systems  Constitutional: Negative for fever and diaphoresis.  HENT: Negative for neck pain and neck stiffness.   Respiratory: Negative for shortness of breath.   Cardiovascular: Negative for chest pain, palpitations and leg swelling.  Gastrointestinal: Negative for nausea and abdominal pain.  Musculoskeletal: Positive for arthralgias. Negative for joint swelling.  Neurological: Negative for headaches.  All other systems reviewed and are negative.    Allergies  Sulfa antibiotics  Home Medications   Current Outpatient Rx  Name Route Sig Dispense Refill  . BIOTIN 7500 MCG PO TABS Oral Take by mouth daily.    Marland Kitchen VITAMIN D3 2000 UNITS PO TABS Oral Take 1 tablet by mouth daily.      Marland Kitchen DIOVAN 160 MG PO TABS      . ESTRADIOL 0.05 MG/24HR TD PTTW Transdermal Place 1 patch (0.05 mg total) onto the skin 2 (two)  times a week. 8 patch 11  . LEVOTHYROXINE SODIUM 137 MCG PO TABS Oral Take 1 tablet by mouth Daily.    Marland Kitchen LORAZEPAM 0.5 MG PO TABS Oral Take 1 tablet by mouth Three times daily as needed.    . OMEGA 3 1000 MG PO CAPS Oral Take 2 capsules by mouth daily.      Marland Kitchen OMEPRAZOLE-SODIUM BICARBONATE 40-1100 MG PO CAPS Oral Take 1 capsule by mouth Daily.    . OXYCODONE HCL ER 20 MG PO TB12 Oral Take 10 mg by mouth every 12 (twelve) hours as needed.     Marland Kitchen PHENAZOPYRIDINE HCL 200 MG PO TABS Oral Take 200 mg by mouth 3 (three) times daily as needed.      Marland Kitchen PROBIOTIC FORMULA PO CAPS Oral Take 1 capsule by mouth at bedtime.      Marland Kitchen VITAMIN B-12 1000 MCG PO TABS Oral Take 1,000 mcg by mouth daily.        BP 141/86  Pulse 121  Temp 97.8 F (36.6 C) (Oral)  Ht 5\' 6"  (1.676 m)   Wt 189 lb (85.73 kg)  BMI 30.51 kg/m2  SpO2 100%  Physical Exam  Constitutional: She is oriented to person, place, and time. She appears well-developed and well-nourished. No distress.  HENT:  Head: Normocephalic and atraumatic.  Mouth/Throat: Oropharynx is clear and moist.  Eyes: Conjunctivae normal are normal. Pupils are equal, round, and reactive to light.  Neck: Normal range of motion. Neck supple.  Cardiovascular: Normal rate, regular rhythm and intact distal pulses.   Pulmonary/Chest: Effort normal and breath sounds normal. She has no wheezes. She has no rales.  Abdominal: Soft. Bowel sounds are normal. There is no tenderness.  Musculoskeletal: Normal range of motion. She exhibits no edema and no tenderness.       FROM of the LUE, no snuff box tenderness.  All tendons intact intact sensation and strength to all nerve distributions  Neurological: She is alert and oriented to person, place, and time. She has normal reflexes.  Skin: Skin is warm and dry.  Psychiatric: She has a normal mood and affect.    ED Course  Procedures (including critical care time)  Labs Reviewed  CBC WITH DIFFERENTIAL - Abnormal; Notable for the following:    Hemoglobin 11.7 (*)     HCT 35.8 (*)     MCV 77.8 (*)     MCH 25.4 (*)     All other components within normal limits  COMPREHENSIVE METABOLIC PANEL - Abnormal; Notable for the following:    Potassium 3.1 (*)     Albumin 3.4 (*)     Total Bilirubin 0.1 (*)     GFR calc non Af Amer 69 (*)     GFR calc Af Amer 80 (*)     All other components within normal limits  TROPONIN I  D-DIMER, QUANTITATIVE  TROPONIN I   Dg Chest 2 View  05/05/2012  *RADIOLOGY REPORT*  Clinical Data: Left hand and left arm pain.  Chest tightness.  CHEST - 2 VIEW  Comparison: None.  Findings: The heart size and pulmonary vascularity are normal. The lungs appear clear and expanded without focal air space disease or consolidation. No blunting of the costophrenic angles.   No pneumothorax.  Mediastinal contours appear intact.  Surgical clips in the right upper quadrant.  IMPRESSION: No evidence of active pulmonary disease.   Original Report Authenticated By: Marlon Pel, M.D.      No diagnosis found.  MDM   Date: 05/05/2012  Rate: 105  Rhythm: sinus tachycardia  QRS Axis: normal  Intervals: normal  ST/T Wave abnormalities: normal  Conduction Disutrbances:none  Narrative Interpretation:   Old EKG Reviewed: none available    2 negative troponins and a negative EKG in the setting of ongoing pain is sufficient to exclude ACS.  Doubt ACS.  Pain appears to be musculosckeletal in nature, likely rheumatologic.  Will treat with pain meds.  Patient to follow up with PMD on Monday.  Return for CP, SOb, nausea vomiting or diaphoresis.  Patient verbalizes understanding and agrees to follow up     Khadeja Abt Smitty Cords, MD 05/05/12 [phone]

## 2012-05-05 NOTE — ED Notes (Addendum)
Pt sts she awoke yesterday with left hand pain and last night the pain spread throughout her left arm and became worse. Pt sts she took Pain Aidx2 with some relief. Pt sts she did have an episode of chest tightness earlier tonight.

## 2012-08-30 ENCOUNTER — Encounter: Payer: Self-pay | Admitting: Internal Medicine

## 2012-08-30 ENCOUNTER — Ambulatory Visit (INDEPENDENT_AMBULATORY_CARE_PROVIDER_SITE_OTHER): Payer: BC Managed Care – PPO | Admitting: Internal Medicine

## 2012-08-30 VITALS — BP 121/79 | HR 92 | Temp 97.0°F | Resp 16 | Wt 194.0 lb

## 2012-08-30 LAB — COMPREHENSIVE METABOLIC PANEL
AST: 16 U/L (ref 0–37)
Albumin: 4 g/dL (ref 3.5–5.2)
Alkaline Phosphatase: 124 U/L — ABNORMAL HIGH (ref 39–117)
Glucose, Bld: 98 mg/dL (ref 70–99)
Potassium: 4 mEq/L (ref 3.5–5.3)
Sodium: 142 mEq/L (ref 135–145)
Total Protein: 7.2 g/dL (ref 6.0–8.3)

## 2012-08-30 LAB — CBC WITH DIFFERENTIAL/PLATELET
Basophils Relative: 0 % (ref 0–1)
Hemoglobin: 13.1 g/dL (ref 12.0–15.0)
Lymphocytes Relative: 32 % (ref 12–46)
MCHC: 33.7 g/dL (ref 30.0–36.0)
Monocytes Relative: 6 % (ref 3–12)
Neutro Abs: 6.8 10*3/uL (ref 1.7–7.7)
Neutrophils Relative %: 61 % (ref 43–77)
RBC: 5.15 MIL/uL — ABNORMAL HIGH (ref 3.87–5.11)
WBC: 11.2 10*3/uL — ABNORMAL HIGH (ref 4.0–10.5)

## 2012-08-30 LAB — TSH: TSH: 0.131 u[IU]/mL — ABNORMAL LOW (ref 0.350–4.500)

## 2012-08-30 LAB — LIPID PANEL
LDL Cholesterol: 139 mg/dL — ABNORMAL HIGH (ref 0–99)
Triglycerides: 120 mg/dL (ref <150)

## 2012-08-30 MED ORDER — BUPROPION HCL ER (SR) 100 MG PO TB12: ORAL_TABLET | ORAL | Status: DC

## 2012-08-30 NOTE — Progress Notes (Signed)
Subjective:    Patient ID: Kayla Ferguson, female    DOB: 12/08/1950, 62 y.o.   MRN: [id number]  HPI Dr[facility]ill is here for acute visit.  Several weeks of depressed mood,  Crying , trouble "getting started",  Difficulty concentrating.  Has a history of depression but came off meds.   Pt has never seen a psychiatrist and is wondering about transmagnetic TX.  FH mother with depression  She denies S/H ideation,  No psychotic features.    Allergies  Allergen Reactions  . Sulfa Antibiotics Nausea And Vomiting   Past Medical History  Diagnosis Date  . Menopause   . Osteopenia   . Anxiety   . Depression   . GERD (gastroesophageal reflux disease)   . Hypertension   . Thyroid disease   . Migraine   . Renal calculi     bilateral  . Gastric polyps   . Fatty liver    Past Surgical History  Procedure Laterality Date  . Abdominal hysterectomy  3/03  . Breast surgery  3/09    breast reduction  . Cosmetic surgery    . Total thyroidectomy  12/2009  . Cholecystectomy  5/07  . Lithotripsy  2007, 2010   History   Social History  . Marital Status: Married    Spouse Name: N/A    Number of Children: N/A  . Years of Education: N/A   Occupational History  . Not on file.   Social History Main Topics  . Smoking status: Never Smoker   . Smokeless tobacco: Never Used  . Alcohol Use: Yes     Comment: socially  . Drug Use: No  . Sexually Active: Yes    Birth Control/ Protection: Post-menopausal   Other Topics Concern  . Not on file   Social History Narrative  . No narrative on file   Family History  Problem Relation Age of Onset  . Hypertension Mother   . Thyroid disease Mother   . Kidney disease Father   . Fibromyalgia Sister   . Lung disease Sister     pulmonary fibrosis  . Breast cancer Maternal Aunt   . Heart disease Maternal Grandfather    Patient Active Problem List  Diagnosis  . Anxiety  . Depression  . GERD (gastroesophageal reflux disease)  . Hypertension  .  Thyroid disease  . Menopause  . Migraine  . Osteopenia  . Renal calculi  . Gastric polyps  . Fatty liver  . S/P hysterectomy   Current Outpatient Prescriptions on File Prior to Visit  Medication Sig Dispense Refill  . levothyroxine (SYNTHROID, LEVOTHROID) 137 MCG tablet Take 1 tablet by mouth Daily.      Marland Kitchen LORazepam (ATIVAN) 0.5 MG tablet Take 1 tablet by mouth Three times daily as needed.      . OMEGA 3 1000 MG CAPS Take 2 capsules by mouth daily.        Marland Kitchen omeprazole-sodium bicarbonate (ZEGERID) 40-1100 MG per capsule Take 1 capsule by mouth Daily.      . Biotin 7500 MCG TABS Take by mouth daily.      . Cholecalciferol (VITAMIN D3) 2000 UNITS TABS Take 1 tablet by mouth daily.        Marland Kitchen estradiol (VIVELLE-DOT) 0.05 MG/24HR Place 1 patch (0.05 mg total) onto the skin 2 (two) times a week.  8 patch  11  . HYDROcodone-acetaminophen (VICODIN) 5-500 MG per tablet Take 1 tablet by mouth every 6 (six) hours as needed for pain.  10  tablet  0  . ibuprofen (ADVIL,MOTRIN) 600 MG tablet Take 1 tablet (600 mg total) by mouth every 6 (six) hours as needed for pain.  30 tablet  0  . oxyCODONE (OXYCONTIN) 20 MG 12 hr tablet Take 10 mg by mouth every 12 (twelve) hours as needed.       . phenazopyridine (PYRIDIUM) 200 MG tablet Take 200 mg by mouth 3 (three) times daily as needed.        . Probiotic Product (PROBIOTIC FORMULA) CAPS Take 1 capsule by mouth at bedtime.        . vitamin B-12 (CYANOCOBALAMIN) 1000 MCG tablet Take 1,000 mcg by mouth daily.         No current facility-administered medications on file prior to visit.       Review of Systems    see HPI Objective:   Physical Exam  Physical Exam  Nursing note and vitals reviewed.  Constitutional: She is oriented to person, place, and time. She appears well-developed and well-nourished.  HENT:  Head: Normocephalic and atraumatic.  Cardiovascular: Normal rate and regular rhythm. Exam reveals no gallop and no friction rub.  No murmur  heard.  Pulmonary/Chest: Breath sounds normal. She has no wheezes. She has no rales.  Neurological: She is alert and oriented to person, place, and time.  Skin: Skin is warm and dry.  Psychiatric: She has a normal mood and affect. Her behavior is normal.            Assessment & Plan:  Depression:  No history of seizure d/o . Will start WEllbutrin SR 100 mg each evening and then go to 200 mg daily after two weeks.  I gave number to therapist  Kayla Ferguson,  Or Vonna Kotyk.   Check TSH and labs today  OK to see Dr. Dub Mikes who does transmagnetic TX for depression  HTN:  Adequate control  I spent 30 minutes with pt.  See me in 4-5 weeks

## 2012-08-30 NOTE — Patient Instructions (Addendum)
Take one tablet each evening for 2 weeks  Then take 2 tablets each evening  [facility]   [phone]  Kayla Ferguson  [phone]

## 2012-08-31 ENCOUNTER — Telehealth: Payer: Self-pay | Admitting: Internal Medicine

## 2012-08-31 NOTE — Telephone Encounter (Signed)
Spoke with pt. And informed of blood work.  She is over-replaced on thyroid med.  She is not sure of current dose she is taking.  I advised her to take 1/2 pill of her levothyroxine on Sat and Sunday and call office on MOnday with accurate dose.  Will make further adjustments on Monday  She voices understanding.  I also advised her to schedule her follow up appt with me

## 2012-09-03 ENCOUNTER — Telehealth: Payer: Self-pay | Admitting: Internal Medicine

## 2012-09-03 ENCOUNTER — Encounter: Payer: Self-pay | Admitting: *Deleted

## 2012-09-03 NOTE — Telephone Encounter (Signed)
Pt states she was to call back with the dosage of her Thyroid medication... It is 137 mcg once a day... Pt states she has had her thyroid taken out... Please call pt  (per pt) at [phone].... Ad

## 2012-10-13 ENCOUNTER — Telehealth: Payer: Self-pay | Admitting: Internal Medicine

## 2012-10-13 MED ORDER — LEVOTHYROXINE SODIUM 125 MCG PO TABS: 125.0000 ug | ORAL_TABLET | Freq: Every day | ORAL | Status: DC

## 2012-10-13 NOTE — Telephone Encounter (Signed)
Spoke with pt . She is feeling better as far as her depression is concerned.  Could not tolerate the 200 mg dose so is taking 100 mg daily  She had been taking 1/2 of her 137 mcg dose of thyroid  Will change her dose to 125 mcg and sent to [facility]  She is to see me in 4-6 weeks she voices understanding and will call Monday to make appt

## 2012-10-30 ENCOUNTER — Encounter: Payer: Self-pay | Admitting: Internal Medicine

## 2012-10-30 ENCOUNTER — Ambulatory Visit (INDEPENDENT_AMBULATORY_CARE_PROVIDER_SITE_OTHER): Payer: BC Managed Care – PPO | Admitting: Internal Medicine

## 2012-10-30 VITALS — BP 136/86 | HR 113 | Temp 98.0°F | Resp 18 | Wt 201.0 lb

## 2012-10-30 DIAGNOSIS — R7989 Other specified abnormal findings of blood chemistry: Secondary | ICD-10-CM

## 2012-10-30 DIAGNOSIS — K279 Peptic ulcer, site unspecified, unspecified as acute or chronic, without hemorrhage or perforation: Secondary | ICD-10-CM

## 2012-10-30 DIAGNOSIS — F329 Major depressive disorder, single episode, unspecified: Secondary | ICD-10-CM

## 2012-10-30 DIAGNOSIS — R946 Abnormal results of thyroid function studies: Secondary | ICD-10-CM

## 2012-10-30 HISTORY — DX: Other specified abnormal findings of blood chemistry: R79.89

## 2012-10-30 HISTORY — DX: Peptic ulcer, site unspecified, unspecified as acute or chronic, without hemorrhage or perforation: K27.9

## 2012-10-30 NOTE — Patient Instructions (Addendum)
Continue Wellbutrin for now  Will set up referral to Psychiatry

## 2012-10-30 NOTE — Progress Notes (Signed)
Subjective:    Patient ID: Kayla Ferguson, female    DOB: 04-Nov-1950, 62 y.o.   MRN: [id number]  HPI  [name] is here for follow up on several issues.    See TSH   She is over-replaced and now is on 125 mcg of levothyroxine (decreased from 137 mcg)  She could not tolerate the 200 mg of WEllbutrin as it made her nauseated so she is taking  100 mg but states it is not helping much.  Still crying ,  Lots of family stress .  She has tried Zoloft, Paxil, Prozac, effexor and she reports it has not helped.  She is asking about Seroquel .  States Cymbalta worked for a little while but not sure she wants to take this again.   She did not make appt with therapist as she states she could not afford it now.    She denies S/H ideation,  No psychotic features.  She has seen a psychiatrist in the remote past but has not seen one in years.     See also reports she has seen her GI MD  Dr. Noe Gens , had recent endoscopy and was told she has a stomach ulcer.  She is on meds but does not know the name >  No dark or bloody stools  Allergies  Allergen Reactions  . Sulfa Antibiotics Nausea And Vomiting   Past Medical History  Diagnosis Date  . Menopause   . Osteopenia   . Anxiety   . Depression   . GERD (gastroesophageal reflux disease)   . Hypertension   . Thyroid disease   . Migraine   . Renal calculi     bilateral  . Gastric polyps   . Fatty liver   . Ulcer    Past Surgical History  Procedure Laterality Date  . Abdominal hysterectomy  3/03  . Breast surgery  3/09    breast reduction  . Cosmetic surgery    . Total thyroidectomy  12/2009  . Cholecystectomy  5/07  . Lithotripsy  2007, 2010   History   Social History  . Marital Status: Married    Spouse Name: N/A    Number of Children: N/A  . Years of Education: N/A   Occupational History  . Not on file.   Social History Main Topics  . Smoking status: Never Smoker   . Smokeless tobacco: Never Used  . Alcohol Use: Yes     Comment:  socially  . Drug Use: No  . Sexually Active: Yes    Birth Control/ Protection: Post-menopausal   Other Topics Concern  . Not on file   Social History Narrative  . No narrative on file   Family History  Problem Relation Age of Onset  . Hypertension Mother   . Thyroid disease Mother   . Kidney disease Father   . Fibromyalgia Sister   . Lung disease Sister     pulmonary fibrosis  . Breast cancer Maternal Aunt   . Heart disease Maternal Grandfather    Patient Active Problem List  Diagnosis  . Anxiety  . Depression  . GERD (gastroesophageal reflux disease)  . Hypertension  . Thyroid disease  . Menopause  . Migraine  . Osteopenia  . Renal calculi  . Gastric polyps  . Fatty liver  . S/P hysterectomy   Current Outpatient Prescriptions on File Prior to Visit  Medication Sig Dispense Refill  . buPROPion (WELLBUTRIN SR) 100 MG 12 hr tablet Take one tablet every  evening for 2 weeks then 2 tablets each evening  90 tablet  1  . Cholecalciferol (VITAMIN D3) 2000 UNITS TABS Take 1 tablet by mouth daily.        Marland Kitchen ibuprofen (ADVIL,MOTRIN) 600 MG tablet Take 1 tablet (600 mg total) by mouth every 6 (six) hours as needed for pain.  30 tablet  0  . levothyroxine (SYNTHROID, LEVOTHROID) 125 MCG tablet Take 1 tablet (125 mcg total) by mouth daily.  90 tablet  3  . LORazepam (ATIVAN) 0.5 MG tablet Take 1 tablet by mouth Three times daily as needed.      Marland Kitchen omeprazole-sodium bicarbonate (ZEGERID) 40-1100 MG per capsule Take 1 capsule by mouth Daily.      Marland Kitchen HYDROcodone-acetaminophen (VICODIN) 5-500 MG per tablet Take 1 tablet by mouth every 6 (six) hours as needed for pain.  10 tablet  0  . oxyCODONE (OXYCONTIN) 20 MG 12 hr tablet Take 10 mg by mouth every 12 (twelve) hours as needed.        No current facility-administered medications on file prior to visit.       Review of Systems    see HPI Objective:   Physical Exam  Physical Exam  Nursing note and vitals reviewed.   Constitutional: She is oriented to person, place, and time. She appears well-developed and well-nourished.  HENT:  Head: Normocephalic and atraumatic.  Cardiovascular: Normal rate and regular rhythm. Exam reveals no gallop and no friction rub.  No murmur heard.  Pulmonary/Chest: Breath sounds normal. She has no wheezes. She has no rales.  Neurological: She is alert and oriented to person, place, and time.  Skin: Skin is warm and dry.  Psychiatric: She has a normal mood and affect. Her behavior is normal.             Assessment & Plan:  Major depression:  At this point I counseled pt that psychiatry referral is necessary.  She has been on multiple SSRIs and SNRIs in past which did not work for her.  She denies suicidal ideation and I will set up referral to Dr. Christell Constant in [place] as this location is near her work  She is to remain on Wellbutrin for now.   Counseled if any worsening depression she is to go to nearest ER.  She voices understanding  LOw TSH? over-replacement   Continue 125 mcg for now and recheck TSH today  Peptic ulcer disease  Continue meds per GI MD Dr. Noe Gens  Fatty liver  See me in 2-3 months and pt  Verbally agreed to call  Office if worsening depression  Addendum:  I spoke with Selena Batten at Dr. Buel Ream office and she scheduled pt for May 9th.  Selena Batten states she will call pt. And inform.

## 2012-10-31 ENCOUNTER — Ambulatory Visit (INDEPENDENT_AMBULATORY_CARE_PROVIDER_SITE_OTHER): Payer: BC Managed Care – PPO | Admitting: Psychiatry

## 2012-10-31 ENCOUNTER — Encounter (HOSPITAL_COMMUNITY): Payer: Self-pay | Admitting: Psychiatry

## 2012-10-31 VITALS — BP 130/76 | HR 92 | Ht 65.0 in | Wt 200.5 lb

## 2012-10-31 DIAGNOSIS — F331 Major depressive disorder, recurrent, moderate: Secondary | ICD-10-CM

## 2012-10-31 MED ORDER — DULOXETINE HCL 30 MG PO CPEP: 30.0000 mg | ORAL_CAPSULE | Freq: Every day | ORAL | Status: DC
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
## 2012-11-01 LAB — CBC WITH DIFFERENTIAL/PLATELET
Lymphocytes Relative: 35 % (ref 12–46)
Lymphs Abs: 3.4 10*3/uL (ref 0.7–4.0)
Neutro Abs: 5.4 10*3/uL (ref 1.7–7.7)
Neutrophils Relative %: 56 % (ref 43–77)
Platelets: 423 10*3/uL — ABNORMAL HIGH (ref 150–400)
RBC: 4.89 MIL/uL (ref 3.87–5.11)
WBC: 9.7 10*3/uL (ref 4.0–10.5)

## 2012-11-01 LAB — COMPREHENSIVE METABOLIC PANEL
ALT: 10 U/L (ref 0–35)
CO2: 27 mEq/L (ref 19–32)
Calcium: 9 mg/dL (ref 8.4–10.5)
Chloride: 104 mEq/L (ref 96–112)
Sodium: 142 mEq/L (ref 135–145)
Total Protein: 6.7 g/dL (ref 6.0–8.3)

## 2012-11-01 LAB — TSH: TSH: 2.69 u[IU]/mL (ref 0.350–4.500)

## 2012-11-05 ENCOUNTER — Encounter: Payer: Self-pay | Admitting: *Deleted

## 2012-11-05 ENCOUNTER — Telehealth: Payer: Self-pay | Admitting: *Deleted

## 2012-11-05 NOTE — Telephone Encounter (Signed)
LVM that thyroid function labs are now normal and to stay on current dosage of meds

## 2012-11-15 ENCOUNTER — Encounter (HOSPITAL_COMMUNITY): Payer: Self-pay | Admitting: Psychiatry

## 2012-11-15 ENCOUNTER — Ambulatory Visit (INDEPENDENT_AMBULATORY_CARE_PROVIDER_SITE_OTHER): Payer: BC Managed Care – PPO | Admitting: Psychiatry

## 2012-11-15 VITALS — BP 122/74 | HR 96 | Ht 65.0 in | Wt 200.0 lb

## 2012-11-15 DIAGNOSIS — F331 Major depressive disorder, recurrent, moderate: Secondary | ICD-10-CM

## 2012-11-15 MED ORDER — DULOXETINE HCL 30 MG PO CPEP: 30.0000 mg | ORAL_CAPSULE | Freq: Every day | ORAL | Status: DC
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
## 2012-11-16 ENCOUNTER — Ambulatory Visit (HOSPITAL_COMMUNITY): Payer: BC Managed Care – PPO | Admitting: Psychiatry

## 2012-11-18 ENCOUNTER — Encounter (HOSPITAL_COMMUNITY): Payer: Self-pay | Admitting: Psychiatry

## 2012-11-23 DIAGNOSIS — F331 Major depressive disorder, recurrent, moderate: Secondary | ICD-10-CM | POA: Insufficient documentation

## 2012-11-23 HISTORY — DX: Major depressive disorder, recurrent, moderate: F33.1

## 2012-11-30 ENCOUNTER — Telehealth (HOSPITAL_COMMUNITY): Payer: Self-pay

## 2012-11-30 DIAGNOSIS — F331 Major depressive disorder, recurrent, moderate: Secondary | ICD-10-CM

## 2012-11-30 MED ORDER — DULOXETINE HCL 30 MG PO CPEP: 90.0000 mg | ORAL_CAPSULE | Freq: Every day | ORAL | Status: DC

## 2012-11-30 NOTE — Addendum Note (Signed)
Addended by: Larena Sox on: 11/30/2012 04:50 PM   Modules accepted: Orders

## 2012-11-30 NOTE — Telephone Encounter (Addendum)
Patient called reporting that Cymbalta at 60 mg was not working. Called patient and left message of the same.  PLAN: As noted in previous plan will increase to 90 mg daily.

## 2012-11-30 NOTE — Telephone Encounter (Signed)
Needs refill of cymbalta doesn't believe it is working and would like to increase to 90mg . If it is before 6 call [phone]

## 2012-12-20 ENCOUNTER — Ambulatory Visit (HOSPITAL_COMMUNITY): Payer: Self-pay | Admitting: Psychiatry

## 2012-12-24 ENCOUNTER — Ambulatory Visit (HOSPITAL_COMMUNITY): Payer: Self-pay | Admitting: Psychiatry

## 2012-12-25 ENCOUNTER — Ambulatory Visit (HOSPITAL_COMMUNITY): Payer: Self-pay | Admitting: Psychiatry

## 2013-01-01 ENCOUNTER — Ambulatory Visit (INDEPENDENT_AMBULATORY_CARE_PROVIDER_SITE_OTHER): Payer: BC Managed Care – PPO | Admitting: Psychiatry

## 2013-01-01 ENCOUNTER — Encounter (HOSPITAL_COMMUNITY): Payer: Self-pay | Admitting: Psychiatry

## 2013-01-01 VITALS — BP 121/78 | HR 103 | Ht 65.0 in | Wt 198.0 lb

## 2013-01-01 DIAGNOSIS — F331 Major depressive disorder, recurrent, moderate: Secondary | ICD-10-CM

## 2013-01-01 MED ORDER — DULOXETINE HCL 60 MG PO CPEP: 60.0000 mg | ORAL_CAPSULE | Freq: Every day | ORAL | Status: DC

## 2013-01-01 NOTE — Progress Notes (Signed)
[facility] Follow-up Outpatient Visit   Patient Identification:  Kayla Ferguson Date of Evaluation:  01/01/2013  Chief Complaint:  Chief Complaint  Patient presents with  . Follow-up   History of Chief Complaint:   HPI Comments: 01/01/2013   Chief Complaint: Kayla Ferguson  is a 62 y/o female with a past psychiatric history significant for symptoms of Depression. The patient is referred for psychiatric services for medication management.   The patient states that she has been having a bad day.  The patient reports that her niece has been living with her for the past 3 weeks.  She states that she has been trying to help her niece get a job and become independent but feels her niece has not been trying hard enough to get better.  She reports some difficulties trying to get her niece motivated and feels she is slipping backwards.  She finally confronted her niece about her apparent motivation to improve her life which would help her get her daughter. She relates that her sister also given up custody of her niece and fears her niece is following the same pattern.  The patient reports she is taking her medications and denies any side effects.  In the area of affective symptoms, patient less depressed. Patient denies current suicidal ideation, intent, or plan. Patient denies current homicidal ideation, intent, or plan. Patient denies auditory hallucinations. Patient denies visual hallucinations. Patient denies symptoms of paranoia. Patient states sleep is good, with approximately 6-7 hours of sleep per night. Appetite has decreased with phenteramine. Energy level is good. Patient endorses symptoms of anhedonia. Patient endorses hopelessness, and guilt but denies helplessnes.   Denies any recent episodes consistent with mania, particularly decreased need for sleep with increased energy, grandiosity, impulsivity, hyperverbal and pressured speech, or increased productivity. Denies any  recent  symptoms consistent with psychosis, particularly auditory or visual hallucinations, thought broadcasting/insertion/withdrawal, or ideas of reference. Also denies excessive worry to the point of physical symptoms as well as any panic attacks. Denies any history of trauma or symptoms consistent with PTSD such as flashbacks, nightmares, hypervigilance, feelings of numbness or inability to connect with others.     Progression since onset: slight improvement. Symptoms: As above         Frequency: Almost daily   Severity: Interfering with daily activities to incapacitating. Aggravated by: family issues, medication.     Nighttime awakenings: Rare          Risk factors: family issues PMH as noted above.      Treatments tried: benzodiazepines, counseling (CBT);  herbal remedies; SSRIs.  Compliance with treatment: 100%      Improvement on treatment: Moderate improvement with Cymbalta.         Past compliance problems: None    Anxiety Patient reports no chest pain, dizziness, nausea or palpitations.     Review of Systems  Constitutional: Positive for activity change and appetite change. Negative for fever, chills and fatigue.  Cardiovascular: Negative for chest pain, palpitations and leg swelling.  Gastrointestinal: Positive for abdominal pain and abdominal distention. Negative for nausea, vomiting, diarrhea, constipation and blood in stool.  Musculoskeletal:       Normal gait, with no reported current muscle weakness.  Skin: Positive for rash.  Neurological: Positive for headaches. Negative for dizziness, tremors, seizures and syncope.   Filed Vitals:   01/01/13 1149  BP: 121/78  Pulse: 103  Height: 5\' 5"  (1.651 m)  Weight: 198 lb (89.812 kg)   Physical  Exam  Vitals reviewed. Constitutional: She appears well-developed and well-nourished. No distress.  Skin: She is not diaphoretic.  Musculoskeletal: Strength & Muscle Tone: within normal limits Gait & Station: normal Patient leans:  N/A  Traumatic Brain Injury: Negative   Past Psychiatric History: Reviewed Diagnosis: Patient denies.   Hospitalizations: Patient denies.  Outpatient Care:Patient denies.  Substance Abuse Care:Patient denies.  Self-Mutilation: Patient denies.  Suicidal Attempts:Patient denies.  Violent Behaviors: Patient denies.   Past Medical History:  Reviewed Past Medical History  Diagnosis Date  . Menopause   . Osteopenia   . Anxiety   . Depression   . GERD (gastroesophageal reflux disease)   . Hypertension   . Thyroid disease   . Migraine   . Gastric polyps   . Fatty liver   . Ulcer   . Renal calculi     bilateral   History of Loss of Consciousness:  Negative Seizure History:  Negative Cardiac History:  Negative  Allergies:  Reviewed Allergies  Allergen Reactions  . Sulfa Antibiotics Nausea And Vomiting   Current Medications: Reviewed Current Outpatient Prescriptions  Medication Sig Dispense Refill  . CARAFATE 1 GM/10ML suspension Take 10 mLs by mouth daily.      . Cholecalciferol (VITAMIN D3) 2000 UNITS TABS Take 1 tablet by mouth daily.        Marland Kitchen DIOVAN 160 MG tablet Take 160 mg by mouth daily.      . DULoxetine (CYMBALTA) 30 MG capsule Take 3 capsules (90 mg total) by mouth daily.  90 capsule  1  . ibuprofen (ADVIL,MOTRIN) 600 MG tablet Take 1 tablet (600 mg total) by mouth every 6 (six) hours as needed for pain.  30 tablet  0  . levothyroxine (SYNTHROID, LEVOTHROID) 125 MCG tablet Take 1 tablet (125 mcg total) by mouth daily.  90 tablet  3  . LORazepam (ATIVAN) 0.5 MG tablet Take 1 tablet by mouth Three times daily as needed.      Marland Kitchen omeprazole-sodium bicarbonate (ZEGERID) 40-1100 MG per capsule Take 1 capsule by mouth Daily.      . phenazopyridine (PYRIDIUM) 100 MG tablet       . promethazine (PHENERGAN) 12.5 MG tablet       . QNASL 80 MCG/ACT AERS 1 spray.       No current facility-administered medications for this visit.    Previous Psychotropic  Medications:Reviewed  Medication Dose  Cymbalta-patient denies    Effexor-patient states it helped a lot. One it for one year  Prozac-on it long    paxil-   clonazepam   ativan    Substance Abuse History in the last 12 months: Reviewed Caffiene: 1/2 to 1/4 daily Tobacco: Pateint denies. Alcohol: Rare Illicit drugs: Patient  Medical Consequences of Substance Abuse: Patient denies  Legal Consequences of Substance Abuse:Patient denies  Family Consequences of Substance Abuse: Patient denies  Blackouts:  Negative DT's:  Negative Withdrawal Symptoms:  Negative None  Social History: Reviewed Current Place of Residence: [place], [place] Place of Birth: [place], [place] Family Members: Lives with her husband, and dog. Marital Status:  Married 36 years,  Children: 1  Sons: 1 Relationships: The patient reports that she has no main source of emotional support.  Education:  [facility] Problems/Performance:Patient denies. Religious Beliefs/Practices:Prays. History of Abuse: none Occupational Experiences; Military History:  None. Legal History: Patient denies Hobbies/Interests: Patient denies  Family History:  Reviewed Family History  Problem Relation Age of Onset  . Hypertension Mother   . Thyroid disease Mother   .  Kidney disease Father   . Fibromyalgia Sister   . Lung disease Sister     pulmonary fibrosis  . Breast cancer Maternal Aunt   . Heart disease Maternal Grandfather     Psychiatric specialty examination: Objective:  Appearance: Well Groomed  Patent attorney::  Good  Speech:  Clear and Coherent and Normal Rate  Volume:  Normal  Mood:  "no really good" 3/10  (0=Very depressed; 5=Neutral; 10=Very Happy)   Affect:  Appropriate, Congruent and Full Range  Thought Process:  Coherent, Linear and Logical  Orientation:  Full (Time, Place, and Person)  Thought Content:  WDL  Suicidal Thoughts:  No  Homicidal Thoughts:  No  Judgement:  Fair  Insight:  Fair   Psychomotor Activity:  Normal  Akathisia:  Negative  Handed:  Right  Memory: 3/3 Immediate; 2/3 Recent.  AIMS (if indicated):  Not indicated  Assets:  Communication Skills Desire for Improvement Financial Resources/Insurance    Laboratory/X-Ray Psychological Evaluation(s)  Patient denies.  Patient    Assessment:   AXIS I Major depressive disorder, recurrent episode, moderate  AXIS II No diagnosis  AXIS III Past Medical History  Diagnosis Date  . Menopause   . Osteopenia   . Anxiety   . Depression   . GERD (gastroesophageal reflux disease)   . Hypertension   . Thyroid disease   . Migraine   . Gastric polyps   . Fatty liver   . Ulcer   . Renal calculi     bilateral     AXIS IV other psychosocial or environmental problems  AXIS V 51-60 moderate symptoms   Treatment Plan/Recommendations:   Plan of Care:  PLAN:  1. Affirm with the patient that the medications are taken as ordered. Patient expressed understanding of how their medications were to be used.    Laboratory: No labs warranted at this time.   Psychotherapy: Therapy: brief supportive therapy provided. Continue current services.   Medications: Continue the following psychiatric medications:  a) Duloxetine 30 mg-Take 2 capsules (30 mg total) by mouth daily.  Will consider increase to 90 mg if needed prior to her next visit. Patient was asked to call clinic to report results.  -Risks and benefits, side effects and alternatives discussed with patient, he was given an opportunity to ask questions about her medication, illness, and treatment. All current psychiatric medications have been reviewed and discussed with the patient and adjusted as clinically appropriate. The patient has been provided an accurate and updated list of the medications being now prescribed.   Routine PRN Medications:  Negative  Consultations: The patient was encouraged to keep all PCP and specialty clinic appointments.   Safety Concerns:    Patient told to call clinic if any problems occur. Patient advised to go to  ER  if she should develop SI/HI, side effects, or if symptoms worsen. Has crisis numbers to call if needed.    Other:   8. Patient was instructed to return to clinic in 3 months.  9. The patient was advised to call and cancel their mental health appointment within 24 hours of the appointment, if they are unable to keep the appointment, as well as the three no show and termination from clinic policy. 10. The patient expressed understanding of the plan and agrees with the above.   Jacqulyn Cane, MD 6/24/201411:47 AM

## 2013-04-23 ENCOUNTER — Encounter (HOSPITAL_COMMUNITY): Payer: Self-pay | Admitting: Psychiatry

## 2013-04-23 ENCOUNTER — Ambulatory Visit (INDEPENDENT_AMBULATORY_CARE_PROVIDER_SITE_OTHER): Payer: BC Managed Care – PPO | Admitting: Psychiatry

## 2013-04-23 VITALS — BP 112/75 | HR 122 | Ht 65.0 in | Wt 196.0 lb

## 2013-04-23 DIAGNOSIS — F331 Major depressive disorder, recurrent, moderate: Secondary | ICD-10-CM

## 2013-04-23 MED ORDER — DULOXETINE HCL 30 MG PO CPEP: 30.0000 mg | ORAL_CAPSULE | Freq: Every day | ORAL | Status: AC

## 2013-04-23 NOTE — Progress Notes (Signed)
[facility] Follow-up Outpatient Visit  Kayla Ferguson May 25, 1951   Kayla Ferguson Identification:  Kayla Ferguson Date of Evaluation:  04/23/2013  Chief Complaint:  No chief complaint on file.  History of Chief Complaint:   HPI Comments: 01/01/2013   Chief Complaint: Kayla Ferguson  is a 62 y/o female with a past psychiatric history significant for symptoms of Depression. The Kayla Ferguson is referred for psychiatric services for medication management.   The Kayla Ferguson reports that Kayla Ferguson has multiple sinus infections for the past month.  Kayla Ferguson has been treated with antibiotics and steroids.  The Kayla Ferguson continues to have stress related to herr niece's lack of self-improvement and fears that her niece will never be able to regain custody of her child.   The Kayla Ferguson reports Kayla Ferguson is taking her medications and denies any side effects.  In the area of affective symptoms, Kayla Ferguson less depressed. Kayla Ferguson denies current suicidal ideation, intent, or plan. Kayla Ferguson denies current homicidal ideation, intent, or plan. Kayla Ferguson denies auditory hallucinations. Kayla Ferguson denies visual hallucinations. Kayla Ferguson denies symptoms of paranoia. Kayla Ferguson states sleep is poor. Appetite is poor even without  phentermine for the last 3 days. Energy level is low for the past 3-4 weeks. Kayla Ferguson endorses symptoms of anhedonia. Kayla Ferguson endorses hopelessness, and guilt but denies helplessnes.   Denies any recent episodes consistent with mania, particularly decreased need for sleep with increased energy, grandiosity, impulsivity, hyperverbal and pressured speech, or increased productivity. Denies any  recent symptoms consistent with psychosis, particularly auditory or visual hallucinations, thought broadcasting/insertion/withdrawal, or ideas of reference. Also denies excessive worry to the point of physical symptoms as well as any panic attacks. Denies any history of trauma or symptoms consistent with PTSD such as flashbacks, nightmares,  hypervigilance, feelings of numbness or inability to connect with others.     Progression since onset: slight worsening. Symptoms: As above         Frequency: Almost daily   Severity: Interfering with daily activities. Aggravated by: family issues. Nighttime awakenings:2-3 times per night      Risk factors: family issues PMH as noted above.      Treatments tried: benzodiazepines, counseling (CBT);  herbal remedies; SSRIs.  Compliance with treatment: 100%      Improvement on treatment: Moderate improvement with Cymbalta.         Past compliance problems: None    Review of Systems  Constitutional: Positive for activity change and appetite change. Negative for fever, chills and fatigue.  Cardiovascular: Negative for leg swelling.  Gastrointestinal: Positive for abdominal pain and abdominal distention. Negative for vomiting, diarrhea, constipation and blood in stool.  Musculoskeletal:       Normal gait, with no reported current muscle weakness.  Skin: Positive for rash.  Neurological: Positive for headaches. Negative for tremors, seizures and syncope.   Filed Vitals:   04/23/13 1304  BP: 112/75  Pulse: 122  Height: 5\' 5"  (1.651 m)  Weight: 196 lb (88.905 kg)    Physical Exam  Vitals reviewed. Constitutional: Kayla Ferguson appears well-developed and well-nourished. No distress.  Skin: Kayla Ferguson is not diaphoretic.  Musculoskeletal: Strength & Muscle Tone: within normal limits Gait & Station: normal Kayla Ferguson leans: N/A  Traumatic Brain Injury: Negative   Past Psychiatric History: Reviewed Diagnosis: Kayla Ferguson denies.   Hospitalizations: Kayla Ferguson denies.  Outpatient Care:Kayla Ferguson denies.  Substance Abuse Care:Kayla Ferguson denies.  Self-Mutilation: Kayla Ferguson denies.  Suicidal Attempts:Kayla Ferguson denies.  Violent Behaviors: Kayla Ferguson denies.   Past Medical History:  Reviewed Past Medical History  Diagnosis Date  .  Menopause   . Osteopenia   . Anxiety   . Depression   . GERD (gastroesophageal reflux  disease)   . Hypertension   . Thyroid disease   . Migraine   . Gastric polyps   . Fatty liver   . Ulcer   . Renal calculi     bilateral   History of Loss of Consciousness:  Negative Seizure History:  Negative Cardiac History:  Negative  Allergies:  Reviewed Allergies  Allergen Reactions  . Sulfa Antibiotics Nausea And Vomiting   Current Medications: Reviewed Current Outpatient Prescriptions  Medication Sig Dispense Refill  . Cholecalciferol (VITAMIN D3) 2000 UNITS TABS Take 1 tablet by mouth daily.        Marland Kitchen DIOVAN 160 MG tablet Take 160 mg by mouth daily.      . DULoxetine (CYMBALTA) 60 MG capsule Take 1 capsule (60 mg total) by mouth daily.  90 capsule  1  . levothyroxine (SYNTHROID, LEVOTHROID) 125 MCG tablet Take 1 tablet (125 mcg total) by mouth daily.  90 tablet  3  . omeprazole-sodium bicarbonate (ZEGERID) 40-1100 MG per capsule Take 1 capsule by mouth Daily.      . phenazopyridine (PYRIDIUM) 100 MG tablet       . phentermine 37.5 MG capsule Take 37.5 mg by mouth every morning.      . promethazine (PHENERGAN) 12.5 MG tablet       . QNASL 80 MCG/ACT AERS 1 spray.       No current facility-administered medications for this visit.    Previous Psychotropic Medications:Reviewed  Medication Dose  Cymbalta-Kayla Ferguson denies    Effexor-Kayla Ferguson states it helped a lot. One it for one year  Prozac-on it long    paxil-   clonazepam   ativan    Substance Abuse History in the last 12 months: Reviewed Caffiene: 6 ounces Dr. Reino Kent daily. Tobacco: Pateint denies. Alcohol: Rare Illicit drugs: Kayla Ferguson  Medical Consequences of Substance Abuse: Kayla Ferguson denies  Legal Consequences of Substance Abuse:Kayla Ferguson denies  Family Consequences of Substance Abuse: Kayla Ferguson denies  Blackouts:  Negative DT's:  Negative Withdrawal Symptoms:  Negative None  Social History: Reviewed Current Place of Residence: [place], [place] Place of Birth: [place], [place] Family Members: Lives with her  husband, and dog. Marital Status:  Married 36 years,  Children: 1  Sons: 1 Relationships: The Kayla Ferguson reports that Kayla Ferguson has no main source of emotional support.  Education:  [facility] Problems/Performance:Kayla Ferguson denies. Religious Beliefs/Practices:Prays. History of Abuse: none Occupational Experiences; Military History:  None. Legal History: Kayla Ferguson denies Hobbies/Interests: Kayla Ferguson denies  Family History:  Reviewed Family History  Problem Relation Age of Onset  . Hypertension Mother   . Thyroid disease Mother   . Kidney disease Father   . Fibromyalgia Sister   . Lung disease Sister     pulmonary fibrosis  . Breast cancer Maternal Aunt   . Heart disease Maternal Grandfather     Psychiatric specialty examination: Objective:  Appearance: Well Groomed  Patent attorney::  Good  Speech:  Clear and Coherent and Normal Rate  Volume:  Normal  Mood:  "depressed" Depression: 4/10 (0=Very depressed; 5=Neutral; 10=Very Happy)  Anxiety- 5/10 (0=no anxiety; 5= moderate/tolerable anxiety; 10= panic attacks)   Affect:  Appropriate, Congruent and Full Range  Thought Process:  Coherent, Linear and Logical  Orientation:  Full (Time, Place, and Person)  Thought Content:  WDL  Suicidal Thoughts:  No  Homicidal Thoughts:  No  Judgement:  Fair  Insight:  Fair  Psychomotor  Activity:  Normal  Akathisia:  Negative  Handed:  Right  Memory: 3/3 Immediate; 3/3 Recent.  AIMS (if indicated):  Not indicated  Assets:  Communication Skills Desire for Improvement Financial Resources/Insurance    Laboratory/X-Ray Psychological Evaluation(s)  Kayla Ferguson denies.  Kayla Ferguson    Assessment:   AXIS I Major depressive disorder, recurrent episode, moderate  AXIS II No diagnosis  AXIS III Past Medical History  Diagnosis Date  . Menopause   . Osteopenia   . Anxiety   . Depression   . GERD (gastroesophageal reflux disease)   . Hypertension   . Thyroid disease   . Migraine   . Gastric polyps    . Fatty liver   . Ulcer   . Renal calculi     bilateral     AXIS IV other psychosocial or environmental problems  AXIS V 51-60 moderate symptoms   Treatment Plan/Recommendations:   Plan of Care:  PLAN:  1. Affirm with the Kayla Ferguson that the medications are taken as ordered. Kayla Ferguson expressed understanding of how their medications were to be used.    Laboratory: No labs warranted at this time.   Psychotherapy: Therapy: brief supportive therapy provided. Continue current services. Advised referral to individual therapy.   Medications: Continue the following psychiatric medications:  a) Duloxetine 60 mg + 30 mg-total 90 mg daily. The Kayla Ferguson was offered an increase or switch of medications at this time but Kayla Ferguson declined. -Risks and benefits, side effects and alternatives discussed with Kayla Ferguson, he was given an opportunity to ask questions about her medication, illness, and treatment. All current psychiatric medications have been reviewed and discussed with the Kayla Ferguson and adjusted as clinically appropriate. The Kayla Ferguson has been provided an accurate and updated list of the medications being now prescribed.   Routine PRN Medications:  Negative  Consultations: The Kayla Ferguson was encouraged to keep all PCP and specialty clinic appointments.   Safety Concerns:   Kayla Ferguson told to call clinic if any problems occur. Kayla Ferguson advised to go to  ER  if Kayla Ferguson should develop SI/HI, side effects, or if symptoms worsen. Has crisis numbers to call if needed.    Other:   8. Kayla Ferguson was instructed to return to clinic in 3 months.  9. The Kayla Ferguson was advised to call and cancel their mental health appointment within 24 hours of the appointment, if they are unable to keep the appointment, as well as the three no show and termination from clinic policy. 10. The Kayla Ferguson expressed understanding of the plan and agrees with the above.   Jacqulyn Cane, MD 10/14/20141:03 PM

## 2013-05-16 ENCOUNTER — Other Ambulatory Visit: Payer: Self-pay

## 2013-07-16 ENCOUNTER — Other Ambulatory Visit (HOSPITAL_COMMUNITY): Payer: Self-pay | Admitting: Psychiatry

## 2013-10-09 ENCOUNTER — Other Ambulatory Visit: Payer: Self-pay | Admitting: Internal Medicine

## 2013-12-18 ENCOUNTER — Ambulatory Visit (INDEPENDENT_AMBULATORY_CARE_PROVIDER_SITE_OTHER): Payer: BC Managed Care – PPO | Admitting: Internal Medicine

## 2013-12-18 ENCOUNTER — Encounter: Payer: Self-pay | Admitting: Internal Medicine

## 2013-12-18 VITALS — BP 131/73 | HR 115 | Temp 98.2°F | Resp 18 | Wt 197.0 lb

## 2013-12-18 DIAGNOSIS — R61 Generalized hyperhidrosis: Secondary | ICD-10-CM

## 2013-12-18 DIAGNOSIS — IMO0001 Reserved for inherently not codable concepts without codable children: Secondary | ICD-10-CM

## 2013-12-18 DIAGNOSIS — K5900 Constipation, unspecified: Secondary | ICD-10-CM

## 2013-12-18 DIAGNOSIS — R454 Irritability and anger: Secondary | ICD-10-CM

## 2013-12-18 NOTE — Progress Notes (Signed)
Subjective:    Patient ID: Artis DelayGail T Ferguson, female    DOB: 1950-08-31, 63 y.o.   MRN: [id number]  HPI  Kayla Ferguson is here for consultation.    She is having increasing irritability especially at work.  She tells me she was gaining weight due to Splenda and came off her vivelle dot.      Her Cymbalta dose has been increased to 90 mg in the last few months  She has noticed that her sweating has increased since then  She also has constipation and is not using anything     Allergies  Allergen Reactions  . Sulfa Antibiotics Nausea And Vomiting   Past Medical History  Diagnosis Date  . Menopause   . Osteopenia   . Anxiety   . Depression   . GERD (gastroesophageal reflux disease)   . Hypertension   . Thyroid disease   . Migraine   . Gastric polyps   . Fatty liver   . Ulcer   . Renal calculi     bilateral  . URI (upper respiratory infection)    Past Surgical History  Procedure Laterality Date  . Abdominal hysterectomy  3/03  . Breast surgery  3/09    breast reduction  . Cosmetic surgery    . Total thyroidectomy  12/2009  . Cholecystectomy  5/07  . Lithotripsy  2007, 2010   History   Social History  . Marital Status: Married    Spouse Name: N/A    Number of Children: N/A  . Years of Education: N/A   Occupational History  . Not on file.   Social History Main Topics  . Smoking status: Never Smoker   . Smokeless tobacco: Never Used  . Alcohol Use: No     Comment: socially  . Drug Use: No  . Sexual Activity: Yes    Partners: Male    Birth Control/ Protection: Post-menopausal   Other Topics Concern  . Not on file   Social History Narrative  . No narrative on file   Family History  Problem Relation Age of Onset  . Hypertension Mother   . Thyroid disease Mother   . Kidney disease Father   . Fibromyalgia Sister   . Lung disease Sister     pulmonary fibrosis  . Breast cancer Maternal Aunt   . Heart disease Maternal Grandfather    Patient Active Problem List     Diagnosis Date Noted  . Major depressive disorder, recurrent episode, moderate 11/23/2012  . Low TSH level 10/30/2012  . Peptic ulcer 10/30/2012  . S/P hysterectomy 01/13/2012  . Anxiety   . GERD (gastroesophageal reflux disease)   . Hypertension   . Thyroid disease   . Menopause   . Migraine   . Osteopenia   . Renal calculi   . Gastric polyps   . Fatty liver    Current Outpatient Prescriptions on File Prior to Visit  Medication Sig Dispense Refill  . Cholecalciferol (VITAMIN D3) 2000 UNITS TABS Take 1 tablet by mouth daily.        Marland Kitchen. DIOVAN 160 MG tablet Take 160 mg by mouth daily.      . DULoxetine (CYMBALTA) 30 MG capsule Take 1 capsule (30 mg total) by mouth daily. Take with 60 mg, total 90 mg daily.  90 capsule  1  . DULoxetine (CYMBALTA) 60 MG capsule TAKE 1 CAPSULE (60 MG TOTAL) BY MOUTH DAILY.  30 capsule  1  . HYDROcodone-acetaminophen (NORCO) 10-325 MG per tablet  Take 1 tablet by mouth every 6 (six) hours as needed for pain.      Marland Kitchen levothyroxine (SYNTHROID, LEVOTHROID) 125 MCG tablet Take 1 tablet (125 mcg total) by mouth daily.  90 tablet  3  . omeprazole-sodium bicarbonate (ZEGERID) 40-1100 MG per capsule Take 1 capsule by mouth Daily.      . phenazopyridine (PYRIDIUM) 100 MG tablet       . phentermine 37.5 MG capsule Take 37.5 mg by mouth every morning.      . promethazine (PHENERGAN) 12.5 MG tablet       . QNASL 80 MCG/ACT AERS 1 spray.       No current facility-administered medications on file prior to visit.       Review of Systems See HPI    Objective:   Physical Exam Physical Exam  Nursing note and vitals reviewed.  Constitutional: She is oriented to person, place, and time. She appears well-developed and well-nourished.  HENT:  Head: Normocephalic and atraumatic.  Cardiovascular: Normal rate and regular rhythm. Exam reveals no gallop and no friction rub.  No murmur heard.  Pulmonary/Chest: Breath sounds normal. She has no wheezes. She has no rales.   Neurological: She is alert and oriented to person, place, and time.  Skin: Skin is warm and dry.  Psychiatric: She has a normal mood and affect. Her behavior is normal.          Assessment & Plan:  Irrtitability  :  I counseled that HT is not an appropriate indication for HT.  I advised to discuss with psychiatrist about alternative to high dose Cymbalta  Sweating  May be due to Cymbalta  See above  Constipation  Ok for [employer] and Miralax

## 2016-01-02 ENCOUNTER — Emergency Department (HOSPITAL_BASED_OUTPATIENT_CLINIC_OR_DEPARTMENT_OTHER): Payer: BLUE CROSS/BLUE SHIELD

## 2016-01-02 ENCOUNTER — Encounter (HOSPITAL_BASED_OUTPATIENT_CLINIC_OR_DEPARTMENT_OTHER): Payer: Self-pay | Admitting: *Deleted

## 2016-01-02 ENCOUNTER — Other Ambulatory Visit: Payer: Self-pay

## 2016-01-02 ENCOUNTER — Emergency Department (HOSPITAL_BASED_OUTPATIENT_CLINIC_OR_DEPARTMENT_OTHER)
Admission: EM | Admit: 2016-01-02 | Discharge: 2016-01-03 | Disposition: A | Discharge status: Home / Self Care | Payer: BLUE CROSS/BLUE SHIELD | Attending: Emergency Medicine | Admitting: Emergency Medicine

## 2016-01-02 DIAGNOSIS — R0789 Other chest pain: Secondary | ICD-10-CM | POA: Insufficient documentation

## 2016-01-02 DIAGNOSIS — I10 Essential (primary) hypertension: Secondary | ICD-10-CM | POA: Diagnosis not present

## 2016-01-02 DIAGNOSIS — F329 Major depressive disorder, single episode, unspecified: Secondary | ICD-10-CM | POA: Insufficient documentation

## 2016-01-02 DIAGNOSIS — Z79899 Other long term (current) drug therapy: Secondary | ICD-10-CM | POA: Diagnosis not present

## 2016-01-02 DIAGNOSIS — R55 Syncope and collapse: Secondary | ICD-10-CM

## 2016-01-02 DIAGNOSIS — R05 Cough: Secondary | ICD-10-CM | POA: Diagnosis not present

## 2016-01-02 DIAGNOSIS — R51 Headache: Secondary | ICD-10-CM | POA: Insufficient documentation

## 2016-01-02 LAB — BASIC METABOLIC PANEL
ANION GAP: 10 (ref 5–15)
BUN: 13 mg/dL (ref 6–20)
CALCIUM: 8.7 mg/dL — AB (ref 8.9–10.3)
CHLORIDE: 103 mmol/L (ref 101–111)
CO2: 26 mmol/L (ref 22–32)
CREATININE: 0.67 mg/dL (ref 0.44–1.00)
GFR calc non Af Amer: >60 mL/min (ref >60)
Glucose, Bld: 87 mg/dL (ref 65–99)
POTASSIUM: 3.1 mmol/L — AB (ref 3.5–5.1)
SODIUM: 139 mmol/L (ref 135–145)

## 2016-01-02 LAB — CBC
HCT: 38.3 % (ref 36.0–46.0)
HEMOGLOBIN: 12.6 g/dL (ref 12.0–15.0)
MCH: 26.1 pg (ref 26.0–34.0)
MCHC: 32.9 g/dL (ref 30.0–36.0)
MCV: 79.3 fL (ref 78.0–100.0)
PLATELETS: 489 10*3/uL — AB (ref 150–400)
RBC: 4.83 MIL/uL (ref 3.87–5.11)
RDW: 15.1 % (ref 11.5–15.5)
WBC: 10.5 10*3/uL (ref 4.0–10.5)

## 2016-01-02 LAB — D-DIMER, QUANTITATIVE (NOT AT ARMC)

## 2016-01-02 LAB — TROPONIN I

## 2016-01-02 LAB — CBG MONITORING, ED: Glucose-Capillary: 89 mg/dL (ref 65–99)

## 2016-01-02 MED ORDER — METOCLOPRAMIDE HCL 5 MG/ML IJ SOLN
10.0000 mg | Freq: Once | INTRAMUSCULAR | Status: AC
  Administered 2016-01-03: 10 mg via INTRAVENOUS
  Filled 2016-01-02: qty 2

## 2016-01-02 MED ORDER — SODIUM CHLORIDE 0.9 % IV BOLUS (SEPSIS)
1000.0000 mL | Freq: Once | INTRAVENOUS | Status: AC
  Administered 2016-01-02: 1000 mL via INTRAVENOUS

## 2016-01-02 MED ORDER — DIPHENHYDRAMINE HCL 50 MG/ML IJ SOLN
25.0000 mg | Freq: Once | INTRAMUSCULAR | Status: AC
  Administered 2016-01-03: 25 mg via INTRAVENOUS
  Filled 2016-01-02: qty 1

## 2016-01-02 MED ORDER — POTASSIUM CHLORIDE CRYS ER 20 MEQ PO TBCR
40.0000 meq | EXTENDED_RELEASE_TABLET | Freq: Once | ORAL | Status: AC
  Administered 2016-01-02: 40 meq via ORAL
  Filled 2016-01-02: qty 2

## 2016-01-02 MED ORDER — KETOROLAC TROMETHAMINE 30 MG/ML IJ SOLN
30.0000 mg | Freq: Once | INTRAMUSCULAR | Status: AC
Start: 2016-01-02 — End: 2016-01-03
  Administered 2016-01-03: 30 mg via INTRAVENOUS
  Filled 2016-01-02: qty 1

## 2016-01-02 NOTE — ED Notes (Signed)
Pt has been feeling unwell for about 2 weeks with URI and some mild sob.  Pt has had a cough.  Pt states that she had been recovering and felt like she was getting better and then today she was laying out at the pool when she began feeling some CP and weakness and generally unwell.  Pt reports very mild pain at this time

## 2016-01-02 NOTE — ED Provider Notes (Signed)
CSN: [id number]     Arrival date & time 01/02/16  1844 History  By signing my name below, I, Marisue HumbleMichelle Chaffee, attest that this documentation has been prepared under the direction and in the presence of Glynn OctaveStephen Arrielle Mcginn, MD . Electronically Signed: Marisue HumbleMichelle Chaffee, Scribe. 01/02/2016. 10:25 PM.    Chief Complaint  Patient presents with  . Chest Pain   The history is provided by the patient. No language interpreter was used.   HPI Comments:  Kayla Ferguson is a 65 y.o. female with PMHx of NIDDM, GERD, HTN, thyroid disease, ulcer, renal calculi and migraines who presents to the Emergency Department complaining of one episode of sudden onset weakness with gradual onset headache, nausea, tremors and light-headedness beginning earlier today after laying at the pool and walking up stairs to her house. She was out in the sun for ~1 hour prior to symptom onset and reports normal PO intake today. Pt states she is feeling weak currently with a headache across her forehead. She ate a sandwich, drank water and sat down which improved her symptoms. She also notes 1 episode of diarrhea this morning. Pt reports intermittent episodes of left side chest pain with shortness of breath yesterday and this morning lasting for a couple seconds. Pt states she has had a cough productive of green progressing to clear mucous, congestion and subjective fever for the past 2 weeks. She also notes chronic lower extremity edema. Pt was recently started on Metformin by Dr. Sheppard EvensAmy Neal. Denies abdominal pain, h/o heart problems, room-spinning dizziness, syncope, vomiting or diaphoresis.  PCP: Dr. Arnette FeltsMike Duran [facility]- Dr. Sheppard EvensAmy Neal  Past Medical History  Diagnosis Date  . Menopause   . Osteopenia   . Anxiety   . Depression   . GERD (gastroesophageal reflux disease)   . Hypertension   . Thyroid disease   . Migraine   . Gastric polyps   . Fatty liver   . Ulcer   . Renal calculi     bilateral  . URI (upper  respiratory infection)    Past Surgical History  Procedure Laterality Date  . Abdominal hysterectomy  3/03  . Breast surgery  3/09    breast reduction  . Cosmetic surgery    . Total thyroidectomy  12/2009  . Cholecystectomy  5/07  . Lithotripsy  2007, 2010   Family History  Problem Relation Age of Onset  . Hypertension Mother   . Thyroid disease Mother   . Kidney disease Father   . Fibromyalgia Sister   . Lung disease Sister     pulmonary fibrosis  . Breast cancer Maternal Aunt   . Heart disease Maternal Grandfather    Social History  Substance Use Topics  . Smoking status: Never Smoker   . Smokeless tobacco: Never Used  . Alcohol Use: No     Comment: socially   OB History    No data available     Review of Systems 10 systems reviewed and all are negative for acute change except as noted in the HPI.   Allergies  Sulfa antibiotics  Home Medications   Prior to Admission medications   Medication Sig Start Date End Date Taking? Authorizing Provider  Cholecalciferol (VITAMIN D3) 2000 UNITS TABS Take 1 tablet by mouth daily.     Yes Historical Provider, MD  dexlansoprazole (DEXILANT) 60 MG capsule Take 60 mg by mouth daily.   Yes Historical Provider, MD  estradiol (ESTRACE) 0.5 MG tablet Take 0.5 mg by mouth  daily.   Yes Historical Provider, MD  levothyroxine (SYNTHROID, LEVOTHROID) 125 MCG tablet Take 1 tablet (125 mcg total) by mouth daily. 10/13/12  Yes Kendrick Ranch, MD  thyroid (ARMOUR) 130 MG tablet Take 130 mg by mouth daily.   Yes Historical Provider, MD  valsartan-hydrochlorothiazide (DIOVAN-HCT) 160-25 MG tablet Take 1 tablet by mouth daily.   Yes Historical Provider, MD  DIOVAN 160 MG tablet Take 160 mg by mouth daily. 10/19/12   Historical Provider, MD  DULoxetine (CYMBALTA) 60 MG capsule TAKE 1 CAPSULE (60 MG TOTAL) BY MOUTH DAILY. 07/16/13   Larena Sox, MD  fluticasone (FLONASE) 50 MCG/ACT nasal spray Place into both nostrils daily.    Historical  Provider, MD  LORazepam (ATIVAN) 0.5 MG tablet Take 0.5 mg by mouth every 8 (eight) hours as needed for anxiety.    Historical Provider, MD  Misc Natural Products (COLON CLEANSE PO) Take 2 tablets by mouth daily.    Historical Provider, MD  omeprazole-sodium bicarbonate (ZEGERID) 40-1100 MG per capsule Take 1 capsule by mouth Daily. 03/10/11   Historical Provider, MD  oxycodone-acetaminophen (LYNOX) 7.5-300 MG per tablet Take 1 tablet by mouth every 8 (eight) hours as needed for pain (for kidney stone).    Historical Provider, MD  phenazopyridine (PYRIDIUM) 100 MG tablet  08/14/12   Historical Provider, MD  phentermine 37.5 MG capsule Take 37.5 mg by mouth every morning.    Historical Provider, MD  promethazine (PHENERGAN) 12.5 MG tablet  10/08/12   Historical Provider, MD  QNASL 80 MCG/ACT AERS 1 spray. 10/21/12   Historical Provider, MD   BP 119/74 mmHg  Pulse 84  Temp(Src) 98.3 F (36.8 C) (Oral)  Resp 16  Ht  (1.676 m)  Wt 209 lb (94.802 kg)  BMI 33.75 kg/m2  SpO2 97%   Physical Exam  Constitutional: She is oriented to person, place, and time. She appears well-developed and well-nourished. No distress.  HENT:  Head: Normocephalic and atraumatic.  Mouth/Throat: Oropharynx is clear and moist. No oropharyngeal exudate.  Eyes: Conjunctivae and EOM are normal. Pupils are equal, round, and reactive to light.  Neck: Normal range of motion. Neck supple.  No meningismus.  Cardiovascular: Normal rate, regular rhythm, normal heart sounds and intact distal pulses.   No murmur heard. Pulmonary/Chest: Effort normal and breath sounds normal. No respiratory distress. She has no wheezes. She has no rales. She exhibits no tenderness.  No chest wall tenderness   Abdominal: Soft. There is no tenderness. There is no rebound and no guarding.  Musculoskeletal: Normal range of motion. She exhibits no edema or tenderness.  Neurological: She is alert and oriented to person, place, and time. No cranial  nerve deficit. She exhibits normal muscle tone. Coordination normal.  No ataxia on finger to nose bilaterally. No pronator drift. 5/5 strength throughout. CN 2-12 intact.Equal grip strength. Sensation intact.   Skin: Skin is warm.  Psychiatric: She has a normal mood and affect. Her behavior is normal.  Nursing note and vitals reviewed.   ED Course  Procedures  DIAGNOSTIC STUDIES:  Oxygen Saturation is 97% on RA, normal by my interpretation.    COORDINATION OF CARE:  10:20 PM Will administer fluids, recheck Troponin and order D-dimer. Discussed treatment plan with pt at bedside and pt agreed to plan.  Labs Review Labs Reviewed  BASIC METABOLIC PANEL - Abnormal; Notable for the following:    Potassium 3.1 (*)    Calcium 8.7 (*)    All other components within  normal limits  CBC - Abnormal; Notable for the following:    Platelets 489 (*)    All other components within normal limits  TROPONIN I  TROPONIN I  D-DIMER, QUANTITATIVE (NOT AT [facility])  CBG MONITORING, ED    Imaging Review Dg Chest 2 View  01/02/2016  CLINICAL DATA:  Subacute onset of shortness of breath, mid chest pain and productive cough. Initial encounter. EXAM: CHEST  2 VIEW COMPARISON:  Chest radiograph performed 05/05/2012 FINDINGS: The lungs are well-aerated and clear. There is no evidence of focal opacification, pleural effusion or pneumothorax. The heart is normal in size; the mediastinal contour is within normal limits. No acute osseous abnormalities are seen. Clips are noted within the right upper quadrant, reflecting prior cholecystectomy. IMPRESSION: No acute cardiopulmonary process seen. Electronically Signed   By: Roanna RaiderJeffery  Chang M.D.   On: 01/02/2016 20:08   Ct Head Wo Contrast  01/02/2016  CLINICAL DATA:  65 year old female with headache and near syncope EXAM: CT HEAD WITHOUT CONTRAST TECHNIQUE: Contiguous axial images were obtained from the base of the skull through the vertex without intravenous contrast.  COMPARISON:  Head CT dated 08/27/2008 FINDINGS: The ventricles and the sulci are appropriate in size for the patient's age. There is no intracranial hemorrhage. No midline shift or mass effect identified. The gray-white matter differentiation is preserved. The visualized paranasal sinuses and mastoid air cells are well aerated. The calvarium is intact. IMPRESSION: No acute intracranial pathology. Electronically Signed   By: Elgie CollardArash  Radparvar M.D.   On: 01/02/2016 23:36   I have personally reviewed and evaluated these images and lab results as part of my medical decision-making.   EKG Interpretation   Date/Time:  Saturday January 02 2016 23:06:15 EDT Ventricular Rate:  80 PR Interval:  180 QRS Duration: 110 QT Interval:  407 QTC Calculation: 470 R Axis:   19 Text Interpretation:  Sinus rhythm Abnormal R-wave progression, early  transition No significant change was found Confirmed by Manus GunningANCOUR  MD,  Alfons Sulkowski [phone]([id number]) on 01/02/2016 11:11:00 PM      MDM   Final diagnoses:  Near syncope  Atypical chest pain  near syncopal episode with lightheadedness, generalized weakness. Had chest pain earlier today lasting for just a few seconds, now resolved.  EKG sinus tachy without acute ST changes. Orthostatics negative. CXR negative.  IVF and PO fluids given.  CBG normal.  Troponin negative x2. D-dimer negative.  Feels improved in the ED, tolerating PO and ambulatory. Suspect near syncopal episode due to heat exposure or possibly hypoglycemia. Low suspicion for ACS. Chest pain fleeting and atypical.   Vitals reassuring.  Patient well appearing. Troponin negative x2. nonfocal neuro exam Advised adequate hydration in hot weather.  Follow up with PCP. Return precautions discussed.  I personally performed the services described in this documentation, which was scribed in my presence. The recorded information has been reviewed and is accurate.    Glynn OctaveStephen Meklit Cotta, MD 01/03/16 [phone]

## 2016-01-03 NOTE — Discharge Instructions (Signed)
Near-Syncope There is no evidence of heart attack or blood clot in the lung. Followup with your doctor. Keep yourself hydrated in the hot weather. Return to the ED if you develop new or worsening symptoms. Near-syncope (commonly known as near fainting) is sudden weakness, dizziness, or feeling like you might pass out. During an episode of near-syncope, you may also develop pale skin, have tunnel vision, or feel sick to your stomach (nauseous). Near-syncope may occur when getting up after sitting or while standing for a long time. It is caused by a sudden decrease in blood flow to the brain. This decrease can result from various causes or triggers, most of which are not serious. However, because near-syncope can sometimes be a sign of something serious, a medical evaluation is required. The specific cause is often not determined. HOME CARE INSTRUCTIONS  Monitor your condition for any changes. The following actions may help to alleviate any discomfort you are experiencing:  Have someone stay with you until you feel stable.  Lie down right away and prop your feet up if you start feeling like you might faint. Breathe deeply and steadily. Wait until all the symptoms have passed. Most of these episodes last only a few minutes. You may feel tired for several hours.   Drink enough fluids to keep your urine clear or pale yellow.   If you are taking blood pressure or heart medicine, get up slowly when seated or lying down. Take several minutes to sit and then stand. This can reduce dizziness.  Follow up with your health care provider as directed. SEEK IMMEDIATE MEDICAL CARE IF:   You have a severe headache.   You have unusual pain in the chest, abdomen, or back.   You are bleeding from the mouth or rectum, or you have black or tarry stool.   You have an irregular or very fast heartbeat.   You have repeated fainting or have seizure-like jerking during an episode.   You faint when sitting or  lying down.   You have confusion.   You have difficulty walking.   You have severe weakness.   You have vision problems.  MAKE SURE YOU:   Understand these instructions.  Will watch your condition.  Will get help right away if you are not doing well or get worse.   This information is not intended to replace advice given to you by your health care provider. Make sure you discuss any questions you have with your health care provider.   Document Released: 06/27/2005 Document Revised: 07/02/2013 Document Reviewed: 11/30/2012 Elsevier Interactive Patient Education [employer]2016 Elsevier Inc.

## 2016-10-20 ENCOUNTER — Ambulatory Visit (INDEPENDENT_AMBULATORY_CARE_PROVIDER_SITE_OTHER): Payer: BLUE CROSS/BLUE SHIELD | Admitting: Cardiovascular Disease

## 2016-10-20 ENCOUNTER — Encounter: Payer: Self-pay | Admitting: Cardiovascular Disease

## 2016-10-20 VITALS — BP 122/68 | HR 105 | Ht 66.0 in | Wt 213.0 lb

## 2016-10-20 DIAGNOSIS — R079 Chest pain, unspecified: Secondary | ICD-10-CM

## 2016-10-20 DIAGNOSIS — R0602 Shortness of breath: Secondary | ICD-10-CM | POA: Diagnosis not present

## 2016-10-20 NOTE — Progress Notes (Signed)
 Cardiology Office Note Date:  10/22/2016   ID:  Kayla Ferguson, DOB 06/23/1951, MRN [id number]  PCP:  [facility]  Cardiologist:  Mike Duran PA-C  Chief Complaint  Patient presents with  . Chest Pain     History of Present Illness: Kayla Ferguson is a 65 y.o. female who presents for consideration of cardiac catheterization. She is referred by Mike Duran, PA-C, who has recently evaluated her for recurrent chest pain and shortness of breath.   The patient reports recent episodes of substernal and left sided chest discomfort, described as a pressure in the chest. These have had no clear precipitating factors, but at times have been related to activity. Symptoms last 20-30 minutes and resolve on their own. No pleuritic component. There is also shortness of breath with activity noted over this same time period. No orthopnea/PND/Edema/Palpitations. No past hx of similar symptoms. A nuclear stress test is performed and is interpreted as no significant ischemia, but on further review there is question of inferior ischemia. I have reviewed the stress test results, and the patient was only able to exercise for 4:30 (4 mets). An echo shows normal LV function with pseudonormal filling, no valvular pathology.    Past Medical History:  Diagnosis Date  . Anxiety   . Depression   . Fatty liver   . Gastric polyps   . GERD (gastroesophageal reflux disease)   . Hypertension   . Menopause   . Migraine   . Osteopenia   . Renal calculi    bilateral  . Thyroid disease   . Ulcer   . URI (upper respiratory infection)     Past Surgical History:  Procedure Laterality Date  . ABDOMINAL HYSTERECTOMY  3/03  . BREAST SURGERY  3/09   breast reduction  . CHOLECYSTECTOMY  5/07  . COSMETIC SURGERY    . LITHOTRIPSY  2007, 2010  . TOTAL THYROIDECTOMY  12/2009    Current Outpatient Prescriptions  Medication Sig Dispense Refill  . aspirin EC 81 MG tablet Take 81 mg by mouth daily.    .  Cholecalciferol (VITAMIN D3) 2000 UNITS TABS Take 1 tablet by mouth daily.      . citalopram (CELEXA) 20 MG tablet Take 20 mg by mouth daily.  4  . dexlansoprazole (DEXILANT) 60 MG capsule Take 60 mg by mouth daily.    . estradiol (ESTRACE) 0.5 MG tablet Take 0.5 mg by mouth daily.    . fluticasone (FLONASE) 50 MCG/ACT nasal spray Place into both nostrils daily.    . LORazepam (ATIVAN) 0.5 MG tablet Take 0.5 mg by mouth every 8 (eight) hours as needed for anxiety.    . Misc Natural Products (COLON CLEANSE PO) Take 2 tablets by mouth daily.    . NATURE-THROID 97.5 MG TABS Take 2 tablets by mouth every morning.  6  . oxycodone-acetaminophen (LYNOX) 7.5-300 MG per tablet Take 1 tablet by mouth every 8 (eight) hours as needed for pain (for kidney stone).    . phenazopyridine (PYRIDIUM) 100 MG tablet     . promethazine (PHENERGAN) 12.5 MG tablet     . valsartan-hydrochlorothiazide (DIOVAN-HCT) 160-25 MG tablet Take 1 tablet by mouth daily.     No current facility-administered medications for this visit.     Allergies:   Sulfa antibiotics   Social History:  The patient  reports that she has never smoked. She has never used smokeless tobacco. She reports that she does not drink alcohol or use drugs.     Family History:  The patient's family history includes Breast cancer in her maternal aunt; Fibromyalgia in her sister; Heart disease in her maternal grandfather; Hypertension in her mother; Kidney disease in her father; Lung disease in her sister; Thyroid disease in her mother.    ROS:  Please see the history of present illness.  Otherwise, review of systems is positive for leg swelling, hearing loss, DOE, back pain, muscle pain, excessive fatigue, anxiety, leg swelling, and headaches.  All other systems are reviewed and negative.    PHYSICAL EXAM: VS:  BP 122/68   Pulse (!) 105   Ht  (1.676 m)   Wt 213 lb (96.6 kg)   SpO2 93%   BMI 34.38 kg/m  , BMI Body mass index is 34.38 kg/m. GEN:  Well nourished, well developed, in no acute distress  HEENT: normal  Neck: no JVD, no masses. No carotid bruits Cardiac: RRR without murmur or gallop                Respiratory:  clear to auscultation bilaterally, normal work of breathing GI: soft, nontender, nondistended, + BS MS: no deformity or atrophy  Ext: no pretibial edema, pedal pulses 2+= bilaterally Skin: warm and dry, no rash Neuro:  Strength and sensation are intact Psych: euthymic mood, full affect  EKG:  EKG is not ordered today.  Recent Labs: 01/02/2016: BUN 13; Creatinine, Ser 0.67; Hemoglobin 12.6; Platelets 489; Potassium 3.1; Sodium 139   Lipid Panel     Component Value Date/Time   CHOL 214 (H) 08/30/2012 1420   TRIG 120 08/30/2012 1420   HDL 51 08/30/2012 1420   CHOLHDL 4.2 08/30/2012 1420   VLDL 24 08/30/2012 1420   LDLCALC 139 (H) 08/30/2012 1420      Wt Readings from Last 3 Encounters:  10/20/16 213 lb (96.6 kg)  01/02/16 209 lb (94.8 kg)  12/18/13 197 lb (89.4 kg)     Cardiac Studies Reviewed: Outside echo and nuclear stress test reviewed as detailed above.   ASSESSMENT AND PLAN: Chest pain/shortness of breath - typical and atypical features. CV risk factors include HTN and atherosclerotic disease in family (mother - stroke 68, MGF - 'massive MI). Nuclear scan with questionable area of inferior ischemia per report and ongoing symptoms of chest pain noted.   We discussed further evaluation with another type of stress test versus definitive evaluation with catheter angiography. The patient strongly favors cardiac catheterization. I have reviewed the risks, indications, and alternatives to cardiac catheterization, possible angioplasty, and stenting with the patient. Risks include but are not limited to bleeding, infection, vascular injury, stroke, myocardial infection, arrhythmia, kidney injury, radiation-related injury in the case of prolonged fluoroscopy use, emergency cardiac surgery, and death. The  patient understands the risks of serious complication is 1-2 in 1000 with diagnostic cardiac cath and 1-2% or less with angioplasty/stenting.   Current medicines are reviewed with the patient today.  The patient does not have concerns regarding medicines.  Labs/ tests ordered today include:   Orders Placed This Encounter  Procedures  . EKG 12-Lead   Disposition:   FU pending cath results  Signed, Tonny Bollman, MD  10/22/2016 9:37 PM    [facility] [address] [place], [place], [place]  [address] Phone: [phone]; Fax: [phone]

## 2016-10-20 NOTE — Patient Instructions (Addendum)
Medication Instructions:  Your physician recommends that you continue on your current medications as directed. Please refer to the Current Medication list given to you today.  Labwork: No new orders.   Testing/Procedures: Your physician has requested that you have a cardiac catheterization. Cardiac catheterization is used to diagnose and/or treat various heart conditions. Doctors may recommend this procedure for a number of different reasons. The most common reason is to evaluate chest pain. Chest pain can be a symptom of coronary artery disease (CAD), and cardiac catheterization can show whether plaque is narrowing or blocking your heart's arteries. This procedure is also used to evaluate the valves, as well as measure the blood flow and oxygen levels in different parts of your heart. For further information please visit [email or web address] Please follow instruction sheet, as given.    [facility] CARDIOVASCULAR DIVISION [facility] ST OFFICE [address], [address] [place] [address] Dept: [phone] Loc: [phone]  Kayla Ferguson  10/20/2016  You are scheduled for a Cardiac Catheterization on Wednesday, April 18 with Dr. Tonny Bollman.  1. Please arrive at the [facility] (Main Entrance A) at [facility]: [address] [place], [place] [address] at 9:30 AM (two hours before your procedure to ensure your preparation). Free valet parking service is available.   Special note: Every effort is made to have your procedure done on time. Please understand that emergencies sometimes delay scheduled procedures.  2. Diet: Do not eat or drink anything after midnight prior to your procedure except sips of water to take medications.  3. Labs: Your labs will be performed at the hospital after you arrive for your procedure.  4. Medication instructions in preparation for your procedure:  On the morning of your procedure, take your Aspirin and  any morning medicines NOT listed above.  You may use sips of water.  5. Plan for one night stay--bring personal belongings.  6. Bring a current list of your medications and current insurance cards.  7. You MUST have a responsible person to drive you home.  8. Someone MUST be with you the first 24 hours after you arrive home or your discharge will be delayed.  9. Please wear clothes that are easy to get on and off and wear slip-on shoes.  Thank you for allowing [place] to care for you!   -- [name]  Follow-Up: We will arrange further follow-up after your cardiac catheterization.   Any Other Special Instructions Will Be Listed Below (If Applicable).     If you need a refill on your cardiac medications before your next appointment, please call your pharmacy.

## 2016-10-26 ENCOUNTER — Encounter (HOSPITAL_COMMUNITY)
Admission: RE | Disposition: A | Discharge status: Home / Self Care | Payer: Self-pay | Source: Ambulatory Visit | Attending: Cardiovascular Disease

## 2016-10-26 ENCOUNTER — Ambulatory Visit (HOSPITAL_COMMUNITY)
Admission: RE | Admit: 2016-10-26 | Discharge: 2016-10-26 | Disposition: A | Discharge status: Home / Self Care | Payer: BLUE CROSS/BLUE SHIELD | Source: Ambulatory Visit | Attending: Cardiovascular Disease | Admitting: Cardiovascular Disease

## 2016-10-26 DIAGNOSIS — I10 Essential (primary) hypertension: Secondary | ICD-10-CM | POA: Insufficient documentation

## 2016-10-26 DIAGNOSIS — Z8249 Family history of ischemic heart disease and other diseases of the circulatory system: Secondary | ICD-10-CM | POA: Insufficient documentation

## 2016-10-26 DIAGNOSIS — K219 Gastro-esophageal reflux disease without esophagitis: Secondary | ICD-10-CM | POA: Diagnosis not present

## 2016-10-26 DIAGNOSIS — Z7982 Long term (current) use of aspirin: Secondary | ICD-10-CM | POA: Diagnosis not present

## 2016-10-26 DIAGNOSIS — Z7951 Long term (current) use of inhaled steroids: Secondary | ICD-10-CM | POA: Diagnosis not present

## 2016-10-26 DIAGNOSIS — M8580 Other specified disorders of bone density and structure, unspecified site: Secondary | ICD-10-CM | POA: Diagnosis not present

## 2016-10-26 DIAGNOSIS — G43909 Migraine, unspecified, not intractable, without status migrainosus: Secondary | ICD-10-CM | POA: Diagnosis not present

## 2016-10-26 DIAGNOSIS — K760 Fatty (change of) liver, not elsewhere classified: Secondary | ICD-10-CM | POA: Insufficient documentation

## 2016-10-26 DIAGNOSIS — R079 Chest pain, unspecified: Secondary | ICD-10-CM

## 2016-10-26 DIAGNOSIS — Z882 Allergy status to sulfonamides status: Secondary | ICD-10-CM | POA: Insufficient documentation

## 2016-10-26 DIAGNOSIS — F419 Anxiety disorder, unspecified: Secondary | ICD-10-CM | POA: Insufficient documentation

## 2016-10-26 DIAGNOSIS — E079 Disorder of thyroid, unspecified: Secondary | ICD-10-CM | POA: Insufficient documentation

## 2016-10-26 DIAGNOSIS — R0789 Other chest pain: Secondary | ICD-10-CM | POA: Diagnosis not present

## 2016-10-26 DIAGNOSIS — F329 Major depressive disorder, single episode, unspecified: Secondary | ICD-10-CM | POA: Diagnosis not present

## 2016-10-26 HISTORY — PX: LEFT HEART CATH AND CORONARY ANGIOGRAPHY: CATH118249

## 2016-10-26 HISTORY — DX: Chest pain, unspecified: R07.9

## 2016-10-26 LAB — BASIC METABOLIC PANEL
Anion gap: 10 (ref 5–15)
BUN: 9 mg/dL (ref 6–20)
CHLORIDE: 103 mmol/L (ref 101–111)
CO2: 27 mmol/L (ref 22–32)
CREATININE: 0.81 mg/dL (ref 0.44–1.00)
Calcium: 9.1 mg/dL (ref 8.9–10.3)
GFR calc Af Amer: >60 mL/min (ref >60)
GFR calc non Af Amer: >60 mL/min (ref >60)
Glucose, Bld: 96 mg/dL (ref 65–99)
Potassium: 3.2 mmol/L — ABNORMAL LOW (ref 3.5–5.1)
SODIUM: 140 mmol/L (ref 135–145)

## 2016-10-26 LAB — CBC
HEMATOCRIT: 41.4 % (ref 36.0–46.0)
Hemoglobin: 13.5 g/dL (ref 12.0–15.0)
MCH: 26 pg (ref 26.0–34.0)
MCHC: 32.6 g/dL (ref 30.0–36.0)
MCV: 79.8 fL (ref 78.0–100.0)
PLATELETS: 375 10*3/uL (ref 150–400)
RBC: 5.19 MIL/uL — ABNORMAL HIGH (ref 3.87–5.11)
RDW: 14.7 % (ref 11.5–15.5)
WBC: 7.9 10*3/uL (ref 4.0–10.5)

## 2016-10-26 LAB — PROTIME-INR
INR: 1.03
Prothrombin Time: 13.5 seconds (ref 11.4–15.2)

## 2016-10-26 SURGERY — LEFT HEART CATH AND CORONARY ANGIOGRAPHY
Anesthesia: LOCAL

## 2016-10-26 MED ORDER — POTASSIUM CHLORIDE CRYS ER 20 MEQ PO TBCR
EXTENDED_RELEASE_TABLET | ORAL | Status: AC
  Administered 2016-10-26: 40 meq via ORAL
  Filled 2016-10-26: qty 2

## 2016-10-26 MED ORDER — SODIUM CHLORIDE 0.9 % WEIGHT BASED INFUSION
3.0000 mL/kg/h | INTRAVENOUS | Status: AC
  Administered 2016-10-26: 3 mL/kg/h via INTRAVENOUS

## 2016-10-26 MED ORDER — MIDAZOLAM HCL 2 MG/2ML IJ SOLN
INTRAMUSCULAR | Status: DC | PRN
  Administered 2016-10-26: 2 mg via INTRAVENOUS

## 2016-10-26 MED ORDER — ONDANSETRON HCL 4 MG/2ML IJ SOLN: 4.0000 mg | Freq: Four times a day (QID) | INTRAMUSCULAR | Status: DC | PRN

## 2016-10-26 MED ORDER — ACETAMINOPHEN 325 MG PO TABS: 650.0000 mg | ORAL_TABLET | ORAL | Status: DC | PRN

## 2016-10-26 MED ORDER — FENTANYL CITRATE (PF) 100 MCG/2ML IJ SOLN
INTRAMUSCULAR | Status: AC
  Filled 2016-10-26: qty 2

## 2016-10-26 MED ORDER — IOPAMIDOL (ISOVUE-370) INJECTION 76%
INTRAVENOUS | Status: AC
  Filled 2016-10-26: qty 100

## 2016-10-26 MED ORDER — POTASSIUM CHLORIDE CRYS ER 20 MEQ PO TBCR
40.0000 meq | EXTENDED_RELEASE_TABLET | Freq: Once | ORAL | Status: AC
Start: 2016-10-26 — End: 2016-10-26
  Administered 2016-10-26: 40 meq via ORAL

## 2016-10-26 MED ORDER — SODIUM CHLORIDE 0.9 % WEIGHT BASED INFUSION: 1.0000 mL/kg/h | INTRAVENOUS | Status: DC

## 2016-10-26 MED ORDER — ASPIRIN 81 MG PO CHEW: 81.0000 mg | CHEWABLE_TABLET | ORAL | Status: DC

## 2016-10-26 MED ORDER — LIDOCAINE HCL (PF) 1 % IJ SOLN
INTRAMUSCULAR | Status: DC | PRN
  Administered 2016-10-26: 2 mL

## 2016-10-26 MED ORDER — VERAPAMIL HCL 2.5 MG/ML IV SOLN
INTRAVENOUS | Status: AC
  Filled 2016-10-26: qty 2

## 2016-10-26 MED ORDER — HEPARIN (PORCINE) IN NACL 2-0.9 UNIT/ML-% IJ SOLN
INTRAMUSCULAR | Status: AC
  Filled 2016-10-26: qty 1000

## 2016-10-26 MED ORDER — SODIUM CHLORIDE 0.9 % IV SOLN: 250.0000 mL | INTRAVENOUS | Status: DC | PRN

## 2016-10-26 MED ORDER — SODIUM CHLORIDE 0.9% FLUSH: 3.0000 mL | INTRAVENOUS | Status: DC | PRN

## 2016-10-26 MED ORDER — MIDAZOLAM HCL 2 MG/2ML IJ SOLN
INTRAMUSCULAR | Status: AC
  Filled 2016-10-26: qty 2

## 2016-10-26 MED ORDER — FENTANYL CITRATE (PF) 100 MCG/2ML IJ SOLN
INTRAMUSCULAR | Status: DC | PRN
  Administered 2016-10-26: 25 ug via INTRAVENOUS

## 2016-10-26 MED ORDER — HEPARIN SODIUM (PORCINE) 1000 UNIT/ML IJ SOLN
INTRAMUSCULAR | Status: DC | PRN
  Administered 2016-10-26: 5000 [IU] via INTRAVENOUS

## 2016-10-26 MED ORDER — HEPARIN (PORCINE) IN NACL 2-0.9 UNIT/ML-% IJ SOLN
INTRAMUSCULAR | Status: DC | PRN
  Administered 2016-10-26: 1000 mL

## 2016-10-26 MED ORDER — LIDOCAINE HCL 1 % IJ SOLN
INTRAMUSCULAR | Status: AC
  Filled 2016-10-26: qty 20

## 2016-10-26 MED ORDER — SODIUM CHLORIDE 0.9% FLUSH: 3.0000 mL | Freq: Two times a day (BID) | INTRAVENOUS | Status: DC

## 2016-10-26 MED ORDER — IOPAMIDOL (ISOVUE-370) INJECTION 76%
INTRAVENOUS | Status: DC | PRN
  Administered 2016-10-26: 40 mL via INTRA_ARTERIAL

## 2016-10-26 MED ORDER — HEPARIN SODIUM (PORCINE) 1000 UNIT/ML IJ SOLN
INTRAMUSCULAR | Status: AC
  Filled 2016-10-26: qty 1

## 2016-10-26 MED ORDER — VERAPAMIL HCL 2.5 MG/ML IV SOLN
INTRAVENOUS | Status: DC | PRN
  Administered 2016-10-26: 12:00:00 via INTRA_ARTERIAL

## 2016-10-26 SURGICAL SUPPLY — 9 items

## 2016-10-26 NOTE — H&P (View-Only) (Signed)
Cardiology Office Note Date:  10/22/2016   ID:  Kayla Ferguson, DOB 12/21/1950, MRN [id number]  PCP:  [facility]  Cardiologist:  Arnette Felts PA-C  Chief Complaint  Patient presents with  . Chest Pain     History of Present Illness: Kayla Ferguson is a 66 y.o. female who presents for consideration of cardiac catheterization. She is referred by Arnette Felts, PA-C, who has recently evaluated her for recurrent chest pain and shortness of breath.   The patient reports recent episodes of substernal and left sided chest discomfort, described as a pressure in the chest. These have had no clear precipitating factors, but at times have been related to activity. Symptoms last 20-30 minutes and resolve on their own. No pleuritic component. There is also shortness of breath with activity noted over this same time period. No orthopnea/PND/Edema/Palpitations. No past hx of similar symptoms. A nuclear stress test is performed and is interpreted as no significant ischemia, but on further review there is question of inferior ischemia. I have reviewed the stress test results, and the patient was only able to exercise for 4:30 (4 mets). An echo shows normal LV function with pseudonormal filling, no valvular pathology.    Past Medical History:  Diagnosis Date  . Anxiety   . Depression   . Fatty liver   . Gastric polyps   . GERD (gastroesophageal reflux disease)   . Hypertension   . Menopause   . Migraine   . Osteopenia   . Renal calculi    bilateral  . Thyroid disease   . Ulcer   . URI (upper respiratory infection)     Past Surgical History:  Procedure Laterality Date  . ABDOMINAL HYSTERECTOMY  3/03  . BREAST SURGERY  3/09   breast reduction  . CHOLECYSTECTOMY  5/07  . COSMETIC SURGERY    . LITHOTRIPSY  2007, 2010  . TOTAL THYROIDECTOMY  12/2009    Current Outpatient Prescriptions  Medication Sig Dispense Refill  . aspirin EC 81 MG tablet Take 81 mg by mouth daily.    .  Cholecalciferol (VITAMIN D3) 2000 UNITS TABS Take 1 tablet by mouth daily.      . citalopram (CELEXA) 20 MG tablet Take 20 mg by mouth daily.  4  . dexlansoprazole (DEXILANT) 60 MG capsule Take 60 mg by mouth daily.    Marland Kitchen estradiol (ESTRACE) 0.5 MG tablet Take 0.5 mg by mouth daily.    . fluticasone (FLONASE) 50 MCG/ACT nasal spray Place into both nostrils daily.    Marland Kitchen LORazepam (ATIVAN) 0.5 MG tablet Take 0.5 mg by mouth every 8 (eight) hours as needed for anxiety.    . Misc Natural Products (COLON CLEANSE PO) Take 2 tablets by mouth daily.    Marland Kitchen NATURE-THROID 97.5 MG TABS Take 2 tablets by mouth every morning.  6  . oxycodone-acetaminophen (LYNOX) 7.5-300 MG per tablet Take 1 tablet by mouth every 8 (eight) hours as needed for pain (for kidney stone).    . phenazopyridine (PYRIDIUM) 100 MG tablet     . promethazine (PHENERGAN) 12.5 MG tablet     . valsartan-hydrochlorothiazide (DIOVAN-HCT) 160-25 MG tablet Take 1 tablet by mouth daily.     No current facility-administered medications for this visit.     Allergies:   Sulfa antibiotics   Social History:  The patient  reports that she has never smoked. She has never used smokeless tobacco. She reports that she does not drink alcohol or use drugs.  Family History:  The patient's family history includes Breast cancer in her maternal aunt; Fibromyalgia in her sister; Heart disease in her maternal grandfather; Hypertension in her mother; Kidney disease in her father; Lung disease in her sister; Thyroid disease in her mother.    ROS:  Please see the history of present illness.  Otherwise, review of systems is positive for leg swelling, hearing loss, DOE, back pain, muscle pain, excessive fatigue, anxiety, leg swelling, and headaches.  All other systems are reviewed and negative.    PHYSICAL EXAM: VS:  BP 122/68   Pulse (!) 105   Ht  (1.676 m)   Wt 213 lb (96.6 kg)   SpO2 93%   BMI 34.38 kg/m  , BMI Body mass index is 34.38 kg/m. GEN:  Well nourished, well developed, in no acute distress  HEENT: normal  Neck: no JVD, no masses. No carotid bruits Cardiac: RRR without murmur or gallop                Respiratory:  clear to auscultation bilaterally, normal work of breathing GI: soft, nontender, nondistended, + BS MS: no deformity or atrophy  Ext: no pretibial edema, pedal pulses 2+= bilaterally Skin: warm and dry, no rash Neuro:  Strength and sensation are intact Psych: euthymic mood, full affect  EKG:  EKG is not ordered today.  Recent Labs: 01/02/2016: BUN 13; Creatinine, Ser 0.67; Hemoglobin 12.6; Platelets 489; Potassium 3.1; Sodium 139   Lipid Panel     Component Value Date/Time   CHOL 214 (H) 08/30/2012 1420   TRIG 120 08/30/2012 1420   HDL 51 08/30/2012 1420   CHOLHDL 4.2 08/30/2012 1420   VLDL 24 08/30/2012 1420   LDLCALC 139 (H) 08/30/2012 1420      Wt Readings from Last 3 Encounters:  10/20/16 213 lb (96.6 kg)  01/02/16 209 lb (94.8 kg)  12/18/13 197 lb (89.4 kg)     Cardiac Studies Reviewed: Outside echo and nuclear stress test reviewed as detailed above.   ASSESSMENT AND PLAN: Chest pain/shortness of breath - typical and atypical features. CV risk factors include HTN and atherosclerotic disease in family (mother - stroke 4, MGF - 'massive MI). Nuclear scan with questionable area of inferior ischemia per report and ongoing symptoms of chest pain noted.   We discussed further evaluation with another type of stress test versus definitive evaluation with catheter angiography. The patient strongly favors cardiac catheterization. I have reviewed the risks, indications, and alternatives to cardiac catheterization, possible angioplasty, and stenting with the patient. Risks include but are not limited to bleeding, infection, vascular injury, stroke, myocardial infection, arrhythmia, kidney injury, radiation-related injury in the case of prolonged fluoroscopy use, emergency cardiac surgery, and death. The  patient understands the risks of serious complication is 1-2 in 1000 with diagnostic cardiac cath and 1-2% or less with angioplasty/stenting.   Current medicines are reviewed with the patient today.  The patient does not have concerns regarding medicines.  Labs/ tests ordered today include:   Orders Placed This Encounter  Procedures  . EKG 12-Lead   Disposition:   FU pending cath results  Signed, Tonny Bollman, MD  10/22/2016 9:37 PM    [facility] [address] [place], [place], [place]  [address] Phone: [phone]; Fax: [phone]

## 2016-10-26 NOTE — Interval H&P Note (Signed)
Cath Lab Visit (complete for each Cath Lab visit)  Clinical Evaluation Leading to the Procedure:   ACS: No.  Non-ACS:    Anginal Classification: CCS II  Anti-ischemic medical therapy: No Therapy  Non-Invasive Test Results: Low-risk stress test findings: cardiac mortality <1%/year  Prior CABG: No previous CABG      History and Physical Interval Note:  10/26/2016 12:19 PM  [name]  has presented today for surgery, with the diagnosis of chest pain sob  The various methods of treatment have been discussed with the patient and family. After consideration of risks, benefits and other options for treatment, the patient has consented to  Procedure(s): Left Heart Cath and Coronary Angiography (N/A) as a surgical intervention .  The patient's history has been reviewed, patient examined, no change in status, stable for surgery.  I have reviewed the patient's chart and labs.  Questions were answered to the patient's satisfaction.     Kayla Ferguson

## 2016-10-26 NOTE — Discharge Instructions (Signed)
Radial Site Care °Refer to this sheet in the next few weeks. These instructions provide you with information about caring for yourself after your procedure. Your health care provider may also give you more specific instructions. Your treatment has been planned according to current medical practices, but problems sometimes occur. Call your health care provider if you have any problems or questions after your procedure. °What can I expect after the procedure? °After your procedure, it is typical to have the following: °· Bruising at the radial site that usually fades within 1-2 weeks. °· Blood collecting in the tissue (hematoma) that may be painful to the touch. It should usually decrease in size and tenderness within 1-2 weeks. °Follow these instructions at home: °· Take medicines only as directed by your health care provider. °· You may shower 24-48 hours after the procedure or as directed by your health care provider. Remove the bandage (dressing) and gently wash the site with plain soap and water. Pat the area dry with a clean towel. Do not rub the site, because this may cause bleeding. °· Do not take baths, swim, or use a hot tub until your health care provider approves. °· Check your insertion site every day for redness, swelling, or drainage. °· Do not apply powder or lotion to the site. °· Do not flex or bend the affected arm for 24 hours or as directed by your health care provider. °· Do not push or pull heavy objects with the affected arm for 24 hours or as directed by your health care provider. °· Do not lift over 10 lb (4.5 kg) for 5 days after your procedure or as directed by your health care provider. °· Ask your health care provider when it is okay to: °¨ Return to work or school. °¨ Resume usual physical activities or sports. °¨ Resume sexual activity. °· Do not drive home if you are discharged the same day as the procedure. Have someone else drive you. °· You may drive 24 hours after the procedure  unless otherwise instructed by your health care provider. °· Do not operate machinery or power tools for 24 hours after the procedure. °· If your procedure was done as an outpatient procedure, which means that you went home the same day as your procedure, a responsible adult should be with you for the first 24 hours after you arrive home. °· Keep all follow-up visits as directed by your health care provider. This is important. °Contact a health care provider if: °· You have a fever. °· You have chills. °· You have increased bleeding from the radial site. Hold pressure on the site. °Get help right away if: °· You have unusual pain at the radial site. °· You have redness, warmth, or swelling at the radial site. °· You have drainage (other than a small amount of blood on the dressing) from the radial site. °· The radial site is bleeding, and the bleeding does not stop after 30 minutes of holding steady pressure on the site. °· Your arm or hand becomes pale, cool, tingly, or numb. °This information is not intended to replace advice given to you by your health care provider. Make sure you discuss any questions you have with your health care provider. °Document Released: 07/30/2010 Document Revised: 12/03/2015 Document Reviewed: 01/13/2014 °Elsevier Interactive Patient Education © 2017 Elsevier Inc. ° °

## 2016-10-27 ENCOUNTER — Encounter (HOSPITAL_COMMUNITY): Payer: Self-pay | Admitting: Cardiovascular Disease

## 2017-05-08 DIAGNOSIS — K219 Gastro-esophageal reflux disease without esophagitis: Secondary | ICD-10-CM | POA: Insufficient documentation

## 2017-05-08 DIAGNOSIS — J301 Allergic rhinitis due to pollen: Secondary | ICD-10-CM

## 2017-05-08 DIAGNOSIS — I10 Essential (primary) hypertension: Secondary | ICD-10-CM | POA: Insufficient documentation

## 2017-05-08 DIAGNOSIS — F411 Generalized anxiety disorder: Secondary | ICD-10-CM

## 2017-05-08 DIAGNOSIS — G43909 Migraine, unspecified, not intractable, without status migrainosus: Secondary | ICD-10-CM | POA: Insufficient documentation

## 2017-05-08 DIAGNOSIS — M8580 Other specified disorders of bone density and structure, unspecified site: Secondary | ICD-10-CM | POA: Insufficient documentation

## 2017-05-08 DIAGNOSIS — N200 Calculus of kidney: Secondary | ICD-10-CM

## 2017-05-08 DIAGNOSIS — K317 Polyp of stomach and duodenum: Secondary | ICD-10-CM | POA: Insufficient documentation

## 2017-05-08 HISTORY — DX: Allergic rhinitis due to pollen: J30.1

## 2017-05-08 HISTORY — DX: Generalized anxiety disorder: F41.1

## 2017-05-08 HISTORY — DX: Calculus of kidney: N20.0

## 2017-05-08 HISTORY — DX: Migraine, unspecified, not intractable, without status migrainosus: G43.909

## 2017-09-29 ENCOUNTER — Ambulatory Visit: Payer: BLUE CROSS/BLUE SHIELD | Admitting: Family Medicine

## 2017-09-29 ENCOUNTER — Encounter: Payer: Self-pay | Admitting: Family Medicine

## 2017-09-29 VITALS — BP 124/72 | HR 110 | Temp 98.2°F | Resp 20 | Ht 66.0 in | Wt 220.0 lb

## 2017-09-29 DIAGNOSIS — M7711 Lateral epicondylitis, right elbow: Secondary | ICD-10-CM

## 2017-09-29 DIAGNOSIS — Z7689 Persons encountering health services in other specified circumstances: Secondary | ICD-10-CM | POA: Diagnosis not present

## 2017-09-29 MED ORDER — NAPROXEN 500 MG PO TABS: 500.0000 mg | ORAL_TABLET | Freq: Two times a day (BID) | ORAL | 0 refills | Status: AC

## 2017-09-29 NOTE — Patient Instructions (Signed)
Start naproxen 500 mg BID with food for 7 days.  Wear brace (tennis elbow) daily, when doing anything.   This is Tennis elbow. Without changing the triggering behavior it will continue to return.   Please make an appt to follow up on your chronic medical conditions before requiring refills on them.     Tennis Elbow Tennis elbow is puffiness (inflammation) of the outer tendons of your forearm close to your elbow. Your tendons attach your muscles to your bones. Tennis elbow can happen in any sport or job in which you use your elbow too much. It is caused by doing the same motion over and over. Tennis elbow can cause:  Pain and tenderness in your forearm and the outer part of your elbow.  A burning feeling. This runs from your elbow through your arm.  Weak grip in your hands.  Follow these instructions at home: Activity  Rest your elbow and wrist as told by your doctor. Try to avoid any activities that caused the problem until your doctor says that you can do them again.  If a physical therapist teaches you exercises, do all of them as told.  If you lift an object, lift it with your palm facing up. This is easier on your elbow. Lifestyle  If your tennis elbow is caused by sports, check your equipment and make sure that: ? You are using it correctly. ? It fits you well.  If your tennis elbow is caused by work, take breaks often, if you are able. Talk with your manager about doing your work in a way that is safe for you. ? If your tennis elbow is caused by computer use, talk with your manager about any changes that can be made to your work setup. General instructions  If told, apply ice to the painful area: ? Put ice in a plastic bag. ? Place a towel between your skin and the bag. ? Leave the ice on for 20 minutes, 2-3 times per day.  Take medicines only as told by your doctor.  If you were given a brace, wear it as told by your doctor.  Keep all follow-up visits as told by  your doctor. This is important. Contact a doctor if:  Your pain does not get better with treatment.  Your pain gets worse.  You have weakness in your forearm, hand, or fingers.  You cannot feel your forearm, hand, or fingers. This information is not intended to replace advice given to you by your health care provider. Make sure you discuss any questions you have with your health care provider. Document Released: 12/15/2009 Document Revised: 02/25/2016 Document Reviewed: 06/23/2014 Elsevier Interactive Patient Education  [employer]2018 [employer]

## 2017-09-29 NOTE — Progress Notes (Signed)
Patient ID: Kayla Ferguson, female  DOB: [birth year], 67 y.o.   MRN: [id number] Patient Care Team    Relationship Specialty Notifications Start End  Natalia LeatherwoodKuneff, Lajada Janes A, DO PCP - General Family Medicine  09/29/17     Chief Complaint  Patient presents with  . Elbow Pain    right had injection 2 months ago    Subjective:  Kayla Ferguson is a 67 y.o.  female present for new patient establishment. All past medical history, surgical history, allergies, family history, immunizations, medications and social history were obtained in the electronic medical record today. All recent labs, ED visits and hospitalizations within the last year were reviewed.  Tennis Elbow:  Started 3 months ago. Had a shot of cortisone 3 mos ago, but came back.  Does not do well with oral steroids.   [facility] 09/29/2017 10/30/2012 08/30/2012  Decreased Interest 0 3 3  Down, Depressed, Hopeless 0 3 3  PHQ - 2 Score 0 6 6  Altered sleeping 0 1 1  Tired, decreased energy 3 3 3   Change in appetite 3 3 0  Feeling bad or failure about yourself  2 3 3   Trouble concentrating 0 0 0  Moving slowly or fidgety/restless 0 3 3  Suicidal thoughts 0 0 0  PHQ-9 Score 8 19 16   Difficult doing work/chores Somewhat difficult - -   No flowsheet data found.    Current Exercise Habits: The patient does not participate in regular exercise at present Exercise limited by: None identified Fall Risk  08/30/2012  Falls in the past year? No      There is no immunization history on file for this patient.  No exam data present  Past Medical History:  Diagnosis Date  . Anxiety   . Depression   . Fatty liver   . Frequent UTI   . Gastric polyps   . GERD (gastroesophageal reflux disease)   . Hypertension   . Menopause   . Migraine   . Migraines   . Osteopenia   . Renal calculi    bilateral  . Thyroid disease   . Ulcer   . URI (upper respiratory infection)    Allergies  Allergen Reactions  . Codeine  Other (See Comments)    Nightmares when taking tablets.    . Sulfa Antibiotics Nausea And Vomiting   Past Surgical History:  Procedure Laterality Date  . ABDOMINAL HYSTERECTOMY  3/03  . BREAST SURGERY  3/09   breast reduction  . CESAREAN SECTION    . CHOLECYSTECTOMY  5/07  . COSMETIC SURGERY    . LEFT HEART CATH AND CORONARY ANGIOGRAPHY N/A 10/26/2016   Procedure: Left Heart Cath and Coronary Angiography;  Surgeon: Tonny BollmanMichael Cooper, MD;  [facility];  Service: Cardiovascular;  Laterality: N/A;  . LITHOTRIPSY  2007, 2010  . TOTAL THYROIDECTOMY  12/2009   Family History  Problem Relation Age of Onset  . Kidney disease Father   . Early death Father   . Breast cancer Maternal Aunt   . Hypertension Mother   . Thyroid disease Mother   . Depression Mother   . Stroke Mother   . Fibromyalgia Sister   . Lung disease Sister        pulmonary fibrosis  . Heart disease Maternal Grandfather    Social History   Socioeconomic History  . Marital status: Married    Spouse name: Not on file  . Number of children:  Not on file  . Years of education: Not on file  . Highest education level: Not on file  Occupational History  . Not on file  Social Needs  . Financial resource strain: Not on file  . Food insecurity:    Worry: Not on file    Inability: Not on file  . Transportation needs:    Medical: Not on file    Non-medical: Not on file  Tobacco Use  . Smoking status: Never Smoker  . Smokeless tobacco: Never Used  Substance and Sexual Activity  . Alcohol use: No    Comment: socially  . Drug use: No  . Sexual activity: Not Currently    Partners: Male    Birth control/protection: Post-menopausal  Lifestyle  . Physical activity:    Days per week: Not on file    Minutes per session: Not on file  . Stress: Not on file  Relationships  . Social connections:    Talks on phone: Not on file    Gets together: Not on file    Attends religious service: Not on file     Active member of club or organization: Not on file    Attends meetings of clubs or organizations: Not on file    Relationship status: Not on file  . Intimate partner violence:    Fear of current or ex partner: Not on file    Emotionally abused: Not on file    Physically abused: Not on file    Forced sexual activity: Not on file  Other Topics Concern  . Not on file  Social History Narrative  . Not on file   Allergies as of 09/29/2017      Reactions   Codeine Other (See Comments)   Nightmares when taking tablets.     Sulfa Antibiotics Nausea And Vomiting      Medication List        Accurate as of 09/29/17  2:10 PM. Always use your most recent med list.          ALKA-SELTZER PLUS COLD & COUGH PO Take 2 tablets by mouth 2 (two) times daily as needed (sinuses).   desvenlafaxine 100 MG 24 hr tablet Commonly known as:  PRISTIQ Take by mouth.   DEXILANT 60 MG capsule Generic drug:  dexlansoprazole Take 60 mg by mouth daily.   fluticasone 50 MCG/ACT nasal spray Commonly known as:  FLONASE Place 1 spray into both nostrils daily as needed for allergies.   furosemide 20 MG tablet Commonly known as:  LASIX Take 20 mg by mouth daily.   levocetirizine 5 MG tablet Commonly known as:  XYZAL Take 5 mg by mouth daily.   levothyroxine 112 MCG tablet Commonly known as:  SYNTHROID, LEVOTHROID Take 1 tablet by mouth daily.   liothyronine 5 MCG tablet Commonly known as:  CYTOMEL Take 1 tablet by mouth daily.   losartan 50 MG tablet Commonly known as:  COZAAR Take 1 tablet by mouth daily.   naproxen 500 MG tablet Commonly known as:  NAPROSYN Take 1 tablet (500 mg total) by mouth 2 (two) times daily with a meal.   NATURE-THROID 97.5 MG Tabs Generic drug:  Thyroid Take 2 tablets by mouth every morning.   phenazopyridine 200 MG tablet Commonly known as:  PYRIDIUM Take 200 mg by mouth daily as needed for pain.   promethazine 12.5 MG tablet Commonly known as:   PHENERGAN Take 12.5 mg by mouth every 8 (eight) hours as needed for nausea or  vomiting.   traMADol 50 MG tablet Commonly known as:  ULTRAM Take 50 mg by mouth 3 (three) times daily as needed.       All past medical history, surgical history, allergies, family history, immunizations andmedications were updated in the EMR today and reviewed under the history and medication portions of their EMR.    No results found for this or any previous visit (from the past 2160 hour(s)).  No results found.   ROS: 14 pt review of systems performed and negative (unless mentioned in an HPI)  Objective: BP 124/72 (BP Location: Left Arm, Patient Position: Sitting, Cuff Size: Large)   Pulse (!) 110   Temp 98.2 F (36.8 C)   Resp 20   Ht 5\' 6"  (1.676 m)   Wt 220 lb (99.8 kg)   SpO2 97%   BMI 35.51 kg/m  Gen: Afebrile. No acute distress. Nontoxic in appearance, well-developed, well-nourished, obese Caucasian female. HENT: AT. [place].  MMM Eyes:Pupils Equal Round Reactive to light, Extraocular movements intact,  Conjunctiva without redness, discharge or icterus. CV: RRR  Chest: CTAB, no wheeze, rhonchi or crackles.  Abd: Soft. NTND. BS present.  Skin: no rashes, purpura or petechiae. Warm and well-perfused. Skin intact. Neuro/Msk: Normal gait. PERLA. EOMi. Alert. Oriented x3.   Right elbow: No erythema, no soft tissue swelling.  Tender to palpation lateral epicondyle.  Pain with resisted pronation and supination.  Muscle strength.  Negative Tinel's at wrist and elbow.  Neurovascularly intact distally. Psych: Normal affect, dress and demeanor. Normal speech. Normal thought content and judgment.   Assessment/plan: Kayla Ferguson is a 67 y.o. female present for establish care with acute complaint.  Lateral epicondylitis of right elbow -Naproxen 500 mg twice daily with food for 7 days (USe with PPI- has tolerated in the past).  Patient to purchase a tennis elbow brace, examples were shown to her today.   She is to use this brace during all waking hours.  He was advised to attempt to avoid triggering behavior, which will likely be difficult for her since it is surrounding her job. -She already had an injection, which helped but it quickly returned.  She does not do well with oral steroids. - f/u 4 weeks if not improving. She will need to follow up regarding her [facility] before needing refills since this was not covered today and she established with acute complaint.   Return in about 1 month (around 10/27/2017), or if symptoms worsen or fail to improve.   Note is dictated utilizing voice recognition software. Although note has been proof read prior to signing, occasional typographical errors still can be missed. If any questions arise, please do not hesitate to call for verification.  Electronically signed by: Felix Pacini, DO [facility] [place]

## 2017-10-02 ENCOUNTER — Encounter: Payer: Self-pay | Admitting: Family Medicine

## 2017-11-16 DIAGNOSIS — M5030 Other cervical disc degeneration, unspecified cervical region: Secondary | ICD-10-CM

## 2017-11-16 HISTORY — DX: Other cervical disc degeneration, unspecified cervical region: M50.30

## 2017-12-20 DIAGNOSIS — E559 Vitamin D deficiency, unspecified: Secondary | ICD-10-CM | POA: Insufficient documentation

## 2017-12-20 HISTORY — DX: Vitamin D deficiency, unspecified: E55.9

## 2017-12-25 ENCOUNTER — Encounter: Payer: Self-pay | Admitting: *Deleted

## 2017-12-25 ENCOUNTER — Telehealth: Payer: Self-pay | Admitting: *Deleted

## 2017-12-25 DIAGNOSIS — E890 Postprocedural hypothyroidism: Secondary | ICD-10-CM | POA: Insufficient documentation

## 2017-12-25 HISTORY — DX: Postprocedural hypothyroidism: E89.0

## 2017-12-25 NOTE — Telephone Encounter (Signed)
Faxed referral from  Betsey HolidayAshley Michaels, PA [facility] [place] placed in scheduling box. [phone]  [phone]

## 2018-05-07 ENCOUNTER — Ambulatory Visit: Payer: Self-pay | Admitting: Cardiovascular Disease

## 2018-05-30 ENCOUNTER — Emergency Department (HOSPITAL_COMMUNITY)
Admission: EM | Admit: 2018-05-30 | Discharge: 2018-05-30 | Disposition: A | Discharge status: Home / Self Care | Payer: BLUE CROSS/BLUE SHIELD | Attending: Emergency Medicine | Admitting: Emergency Medicine

## 2018-05-30 ENCOUNTER — Emergency Department (HOSPITAL_COMMUNITY): Payer: BLUE CROSS/BLUE SHIELD

## 2018-05-30 ENCOUNTER — Encounter (HOSPITAL_COMMUNITY): Payer: Self-pay

## 2018-05-30 ENCOUNTER — Other Ambulatory Visit: Payer: Self-pay

## 2018-05-30 DIAGNOSIS — Z79899 Other long term (current) drug therapy: Secondary | ICD-10-CM | POA: Diagnosis not present

## 2018-05-30 DIAGNOSIS — E039 Hypothyroidism, unspecified: Secondary | ICD-10-CM | POA: Diagnosis not present

## 2018-05-30 DIAGNOSIS — R0789 Other chest pain: Secondary | ICD-10-CM | POA: Diagnosis not present

## 2018-05-30 DIAGNOSIS — I10 Essential (primary) hypertension: Secondary | ICD-10-CM | POA: Insufficient documentation

## 2018-05-30 LAB — CBC
HEMATOCRIT: 44.2 % (ref 36.0–46.0)
Hemoglobin: 13.3 g/dL (ref 12.0–15.0)
MCH: 24.9 pg — AB (ref 26.0–34.0)
MCHC: 30.1 g/dL (ref 30.0–36.0)
MCV: 82.8 fL (ref 80.0–100.0)
Platelets: 463 10*3/uL — ABNORMAL HIGH (ref 150–400)
RBC: 5.34 MIL/uL — AB (ref 3.87–5.11)
RDW: 14.6 % (ref 11.5–15.5)
WBC: 13 10*3/uL — ABNORMAL HIGH (ref 4.0–10.5)
nRBC: 0 % (ref 0.0–0.2)

## 2018-05-30 LAB — COMPREHENSIVE METABOLIC PANEL
ALBUMIN: 3.5 g/dL (ref 3.5–5.0)
ALT: 14 U/L (ref 0–44)
ANION GAP: 10 (ref 5–15)
AST: 21 U/L (ref 15–41)
Alkaline Phosphatase: 132 U/L — ABNORMAL HIGH (ref 38–126)
BILIRUBIN TOTAL: 0.5 mg/dL (ref 0.3–1.2)
BUN: 12 mg/dL (ref 8–23)
CHLORIDE: 101 mmol/L (ref 98–111)
CO2: 28 mmol/L (ref 22–32)
Calcium: 8.6 mg/dL — ABNORMAL LOW (ref 8.9–10.3)
Creatinine, Ser: 0.89 mg/dL (ref 0.44–1.00)
GFR calc Af Amer: >60 mL/min (ref >60)
GFR calc non Af Amer: >60 mL/min (ref >60)
GLUCOSE: 127 mg/dL — AB (ref 70–99)
POTASSIUM: 3.4 mmol/L — AB (ref 3.5–5.1)
SODIUM: 139 mmol/L (ref 135–145)
TOTAL PROTEIN: 7.9 g/dL (ref 6.5–8.1)

## 2018-05-30 LAB — I-STAT TROPONIN, ED
TROPONIN I, POC: 0 ng/mL (ref 0.00–0.08)
TROPONIN I, POC: 0 ng/mL (ref 0.00–0.08)

## 2018-05-30 LAB — D-DIMER, QUANTITATIVE: D-Dimer, Quant: 0.58 ug/mL-FEU — ABNORMAL HIGH (ref 0.00–0.50)

## 2018-05-30 NOTE — ED Provider Notes (Signed)
[facility] EMERGENCY DEPARTMENT Provider Note   CSN: [id number] Arrival date & time: 05/30/18  1129     History   Chief Complaint Chief Complaint  Patient presents with  . Chest Pain    HPI Kayla Ferguson is a 67 y.o. female.  67yo F w/ PMH including HTN, migraines, GERD, anxiety/depression who p/w chest pain.  This morning the patient developed a migraine that is consistent with her usual migraine.  She took Imitrex which she has taken previously and approximately 15 minutes later she began having nausea followed by several episodes of vomiting.  She then took Zofran.  She started "not feeling right" with diaphoresis and 2-3 minutes of central, mild chest heaviness with mild pain in her L arm. This resolved tenuously and has not reoccurred.  She has had no associated shortness of breath or new leg swelling.  She took a 4-hour car trip over the weekend.  She denies any history of blood clots.  No alcohol or drug use.  She does note that she had normal heart catherization in 2018.  The history is provided by the patient.    Past Medical History:  Diagnosis Date  . Anxiety   . Chest pain at rest 10/26/2016  . DDD (degenerative disc disease), cervical 11/16/2017  . Depression   . Fatty liver   . Frequent UTI   . GAD (generalized anxiety disorder) 05/08/2017  . Gastric polyps   . GERD (gastroesophageal reflux disease)   . Hypertension   . Low TSH level 10/30/2012  . Major depressive disorder, recurrent episode, moderate (HCC) 11/23/2012  . Menopause   . Migraine   . Migraine 05/08/2017  . Migraines   . Moderate episode of recurrent major depressive disorder (HCC) 11/23/2012  . Osteopenia   . Peptic ulcer 10/30/2012   EGD  2014  Dr. Noe Gens  Overview:  Overview:  EGD  2014  Dr. Noe Gens  . Postoperative hypothyroidism 12/25/2017  . Recurrent nephrolithiasis 05/08/2017  . Renal calculi    bilateral  . S/P hysterectomy 01/13/2012   fibroids  Overview:  Overview:   fibroids  . Seasonal allergic rhinitis due to pollen 05/08/2017  . Thyroid disease   . Ulcer   . Vitamin D deficiency 12/20/2017    Patient Active Problem List   Diagnosis Date Noted  . Postoperative hypothyroidism 12/25/2017  . Vitamin D deficiency 12/20/2017  . DDD (degenerative disc disease), cervical 11/16/2017  . GERD (gastroesophageal reflux disease) 05/08/2017  . Hypertension 05/08/2017  . Migraine 05/08/2017  . Osteopenia 05/08/2017  . Gastric polyps 05/08/2017  . GAD (generalized anxiety disorder) 05/08/2017  . Seasonal allergic rhinitis due to pollen 05/08/2017  . Recurrent nephrolithiasis 05/08/2017  . Major depressive disorder, recurrent episode, moderate (HCC) 11/23/2012  . Moderate episode of recurrent major depressive disorder (HCC) 11/23/2012  . Peptic ulcer 10/30/2012  . S/P hysterectomy 01/13/2012  . Anxiety   . Thyroid disease   . Menopause   . Renal calculi   . Fatty liver     Past Surgical History:  Procedure Laterality Date  . ABDOMINAL HYSTERECTOMY  3/03  . BREAST SURGERY  3/09   breast reduction  . CESAREAN SECTION  1980  . CHOLECYSTECTOMY  5/07  . COSMETIC SURGERY    . LEFT HEART CATH AND CORONARY ANGIOGRAPHY N/A 10/26/2016   Procedure: Left Heart Cath and Coronary Angiography;  Surgeon: Tonny Bollman, MD;  [facility] CV LAB;  Service: Cardiovascular;  Laterality: N/A;  . LITHOTRIPSY  2007, 2010  . TOTAL THYROIDECTOMY  12/2009     OB History    Gravida  1   Para  1   Term      Preterm      AB      Living  1     SAB      TAB      Ectopic      Multiple      Live Births               Home Medications    Prior to Admission medications   Medication Sig Start Date End Date Taking? Authorizing Provider  desvenlafaxine (PRISTIQ) 100 MG 24 hr tablet Take by mouth. 09/13/17   [provider]  dexlansoprazole (DEXILANT) 60 MG capsule Take 60 mg by mouth daily.    [provider]  fluticasone  (FLONASE) 50 MCG/ACT nasal spray Place 1 spray into both nostrils daily as needed for allergies.     [provider]  furosemide (LASIX) 20 MG tablet Take 20 mg by mouth daily. 09/04/17   [provider]  levocetirizine (XYZAL) 5 MG tablet Take 5 mg by mouth daily.    [provider]  levothyroxine (SYNTHROID, LEVOTHROID) 112 MCG tablet Take 1 tablet by mouth daily. 09/04/17   [provider]  liothyronine (CYTOMEL) 5 MCG tablet Take 1 tablet by mouth daily. 05/09/17   [provider]  losartan (COZAAR) 50 MG tablet Take 1 tablet by mouth daily. 02/22/17   [provider]  naproxen (NAPROSYN) 500 MG tablet Take 1 tablet (500 mg total) by mouth 2 (two) times daily with a meal. 09/29/17   Kuneff, Renee A, DO  NATURE-THROID 97.5 MG TABS Take 2 tablets by mouth every morning. 09/28/16   [provider]  phenazopyridine (PYRIDIUM) 200 MG tablet Take 200 mg by mouth daily as needed for pain.    [provider]  Phenyleph-CPM-DM-Aspirin (ALKA-SELTZER PLUS COLD & COUGH PO) Take 2 tablets by mouth 2 (two) times daily as needed (sinuses).    [provider]  promethazine (PHENERGAN) 12.5 MG tablet Take 12.5 mg by mouth every 8 (eight) hours as needed for nausea or vomiting.  10/08/12   [provider]  traMADol (ULTRAM) 50 MG tablet Take 50 mg by mouth 3 (three) times daily as needed. 09/18/17   [provider]    Family History Family History  Problem Relation Age of Onset  . Kidney disease Father   . Early death Father   . Breast cancer Maternal Aunt   . Hypertension Mother   . Thyroid disease Mother   . Depression Mother   . Stroke Mother   . Fibromyalgia Sister   . Lung disease Sister   . Pulmonary fibrosis Sister   . Heart disease Maternal Grandfather     Social History Social History   Tobacco Use  . Smoking status: Never Smoker  . Smokeless tobacco: Never Used  Substance Use Topics  . Alcohol  use: No    Comment: socially  . Drug use: No     Allergies   Codeine and Sulfa antibiotics   Review of Systems Review of Systems All other systems reviewed and are negative except that which was mentioned in HPI   Physical Exam Updated Vital Signs BP (!) 142/81   Pulse 88   Resp (!) 30   Ht 5\' 6"  (1.676 m)   Wt 96.2 kg   SpO2 94%   BMI  34.22 kg/m   Physical Exam  Constitutional: She is oriented to person, place, and time. She appears well-developed and well-nourished. No distress.  HENT:  Head: Normocephalic and atraumatic.  Moist mucous membranes  Eyes: Pupils are equal, round, and reactive to light. Conjunctivae and EOM are normal.  Neck: Neck supple.  Cardiovascular: Normal rate, regular rhythm and normal heart sounds.  No murmur heard. Pulmonary/Chest: Effort normal and breath sounds normal.  Abdominal: Soft. Bowel sounds are normal. She exhibits no distension. There is no tenderness.  Musculoskeletal: She exhibits no edema.       Right lower leg: She exhibits no tenderness.       Left lower leg: She exhibits no tenderness.  Neurological: She is alert and oriented to person, place, and time.  Fluent speech  Skin: Skin is warm and dry.  Psychiatric: She has a normal mood and affect. Judgment normal.  Nursing note and vitals reviewed.    ED Treatments / Results  Labs (all labs ordered are listed, but only abnormal results are displayed) Labs Reviewed  COMPREHENSIVE METABOLIC PANEL - Abnormal; Notable for the following components:      Result Value   Potassium 3.4 (*)    Glucose, Bld 127 (*)    Calcium 8.6 (*)    Alkaline Phosphatase 132 (*)    All other components within normal limits  CBC - Abnormal; Notable for the following components:   WBC 13.0 (*)    RBC 5.34 (*)    MCH 24.9 (*)    Platelets 463 (*)    All other components within normal limits  D-DIMER, QUANTITATIVE (NOT AT [facility]) - Abnormal; Notable for the following components:   D-Dimer,  Quant 0.58 (*)    All other components within normal limits  I-STAT TROPONIN, ED  I-STAT TROPONIN, ED    EKG EKG Interpretation  Date/Time:  Wednesday May 30 2018 11:39:00 EST Ventricular Rate:  95 PR Interval:    QRS Duration: 103 QT Interval:  370 QTC Calculation: 466 R Axis:   16 Text Interpretation:  Sinus rhythm Abnormal R-wave progression, early transition No significant change since last tracing Confirmed by Frederick Peers [phone]) on 05/30/2018 12:51:40 PM   Radiology Dg Chest 2 View  Result Date: 05/30/2018 CLINICAL DATA:  Increasing headaches for the last several days. Chest heaviness with increased blood pressure, nausea and weakness. EXAM: CHEST - 2 VIEW COMPARISON:  01/02/2016 and 05/05/2012. FINDINGS: Lordotic positioning on the AP view. Allowing for this, the heart size and mediastinal contours are stable. The lungs are clear. There is no pleural effusion or pneumothorax. No acute osseous findings are demonstrated. Telemetry leads overlie the chest. IMPRESSION: Stable chest.  No active cardiopulmonary process. Electronically Signed   By: Carey Bullocks M.D.   On: 05/30/2018 13:12    Procedures Procedures (including critical care time)  Medications Ordered in ED Medications - No data to display   Initial Impression / Assessment and Plan / ED Course  I have reviewed the triage vital signs and the nursing notes.  Pertinent labs & imaging results that were available during my care of the patient were reviewed by me and considered in my medical decision making (see chart for details).    Well-appearing, reassuring vital signs, no ischemia on EKG.  She denied any complaints.  Chest pain had resolved after 2 to 3 minutes and had never reoccurred.  Lab work today is reassuring including negative serial troponins, reassuring CMP and CBC.  Dimer is 0.58 which  is less than her age-adjusted cut off of 0.65, making PE very unlikely.  Heart review shows completely  normal cardiac catheterization last year.  Given that her episode was very brief and occurred in the setting of nausea and vomiting related to her migraine, I doubt ACS and I feel she is safe for discharge with close outpatient follow-up.  I have extensively reviewed return precautions regarding any recurrence of her pain or new symptoms.  She voiced understanding.  Final Clinical Impressions(s) / ED Diagnoses   Final diagnoses:  Atypical chest pain    ED Discharge Orders    None       Derion Kreiter, Ambrose Finlandachel Morgan, MD 05/30/18 [phone]

## 2018-05-30 NOTE — ED Triage Notes (Signed)
Pt brought in by EMS due to having chest pain while at work. Pt endorse n/v and sweating. Pt describes as heaviness on her chest. Pt received 324mg  of aspirin. Pt a&ox4.

## 2020-04-03 IMAGING — CR DG CHEST 2V
2 series · 2 of 2 positions shown · non-contrast
Comparison: 01/02/2016 and 05/05/2012.

CLINICAL DATA: Increasing headaches for the last several days.
Chest heaviness with increased blood pressure, nausea and weakness.

EXAM:
CHEST - 2 VIEW

[chest lat]
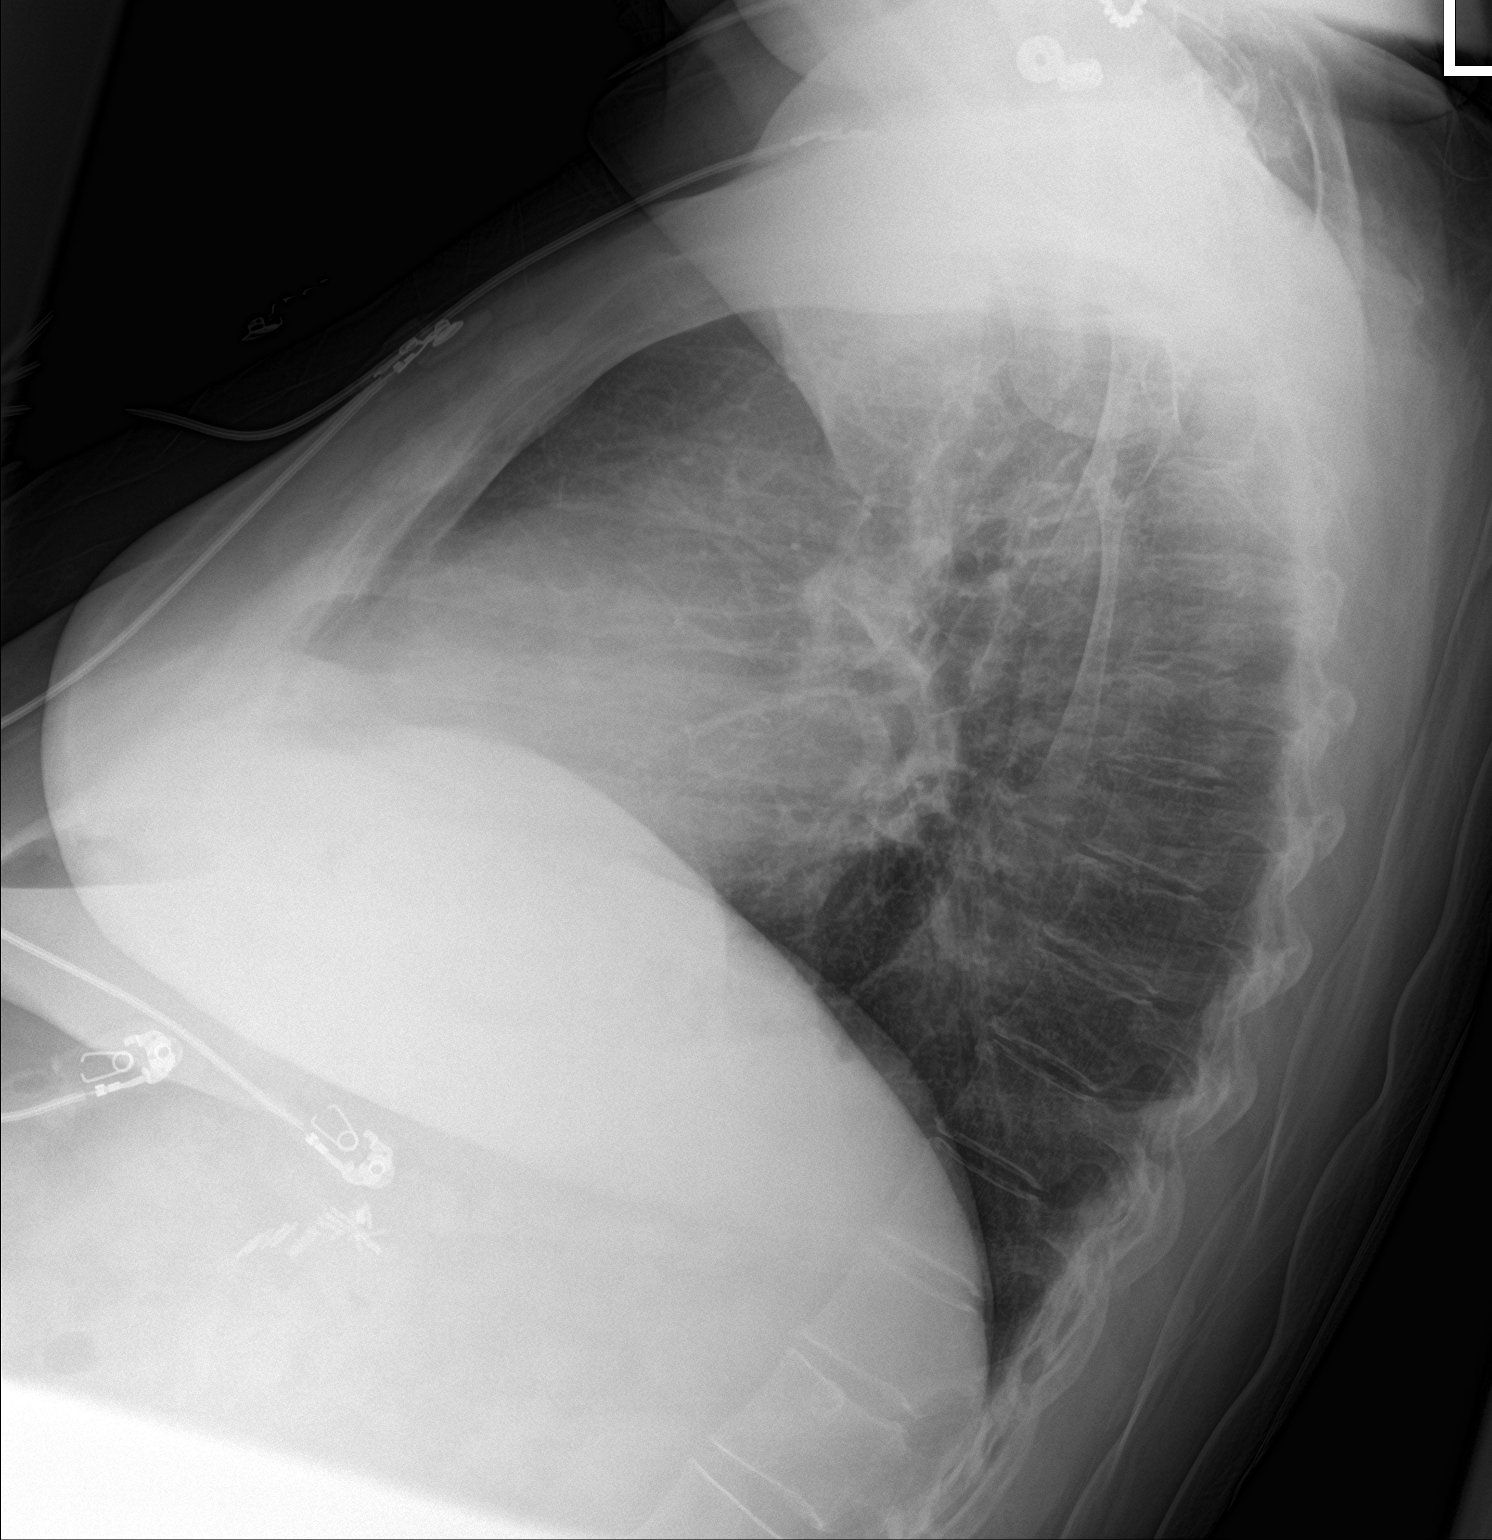

[chest ap]
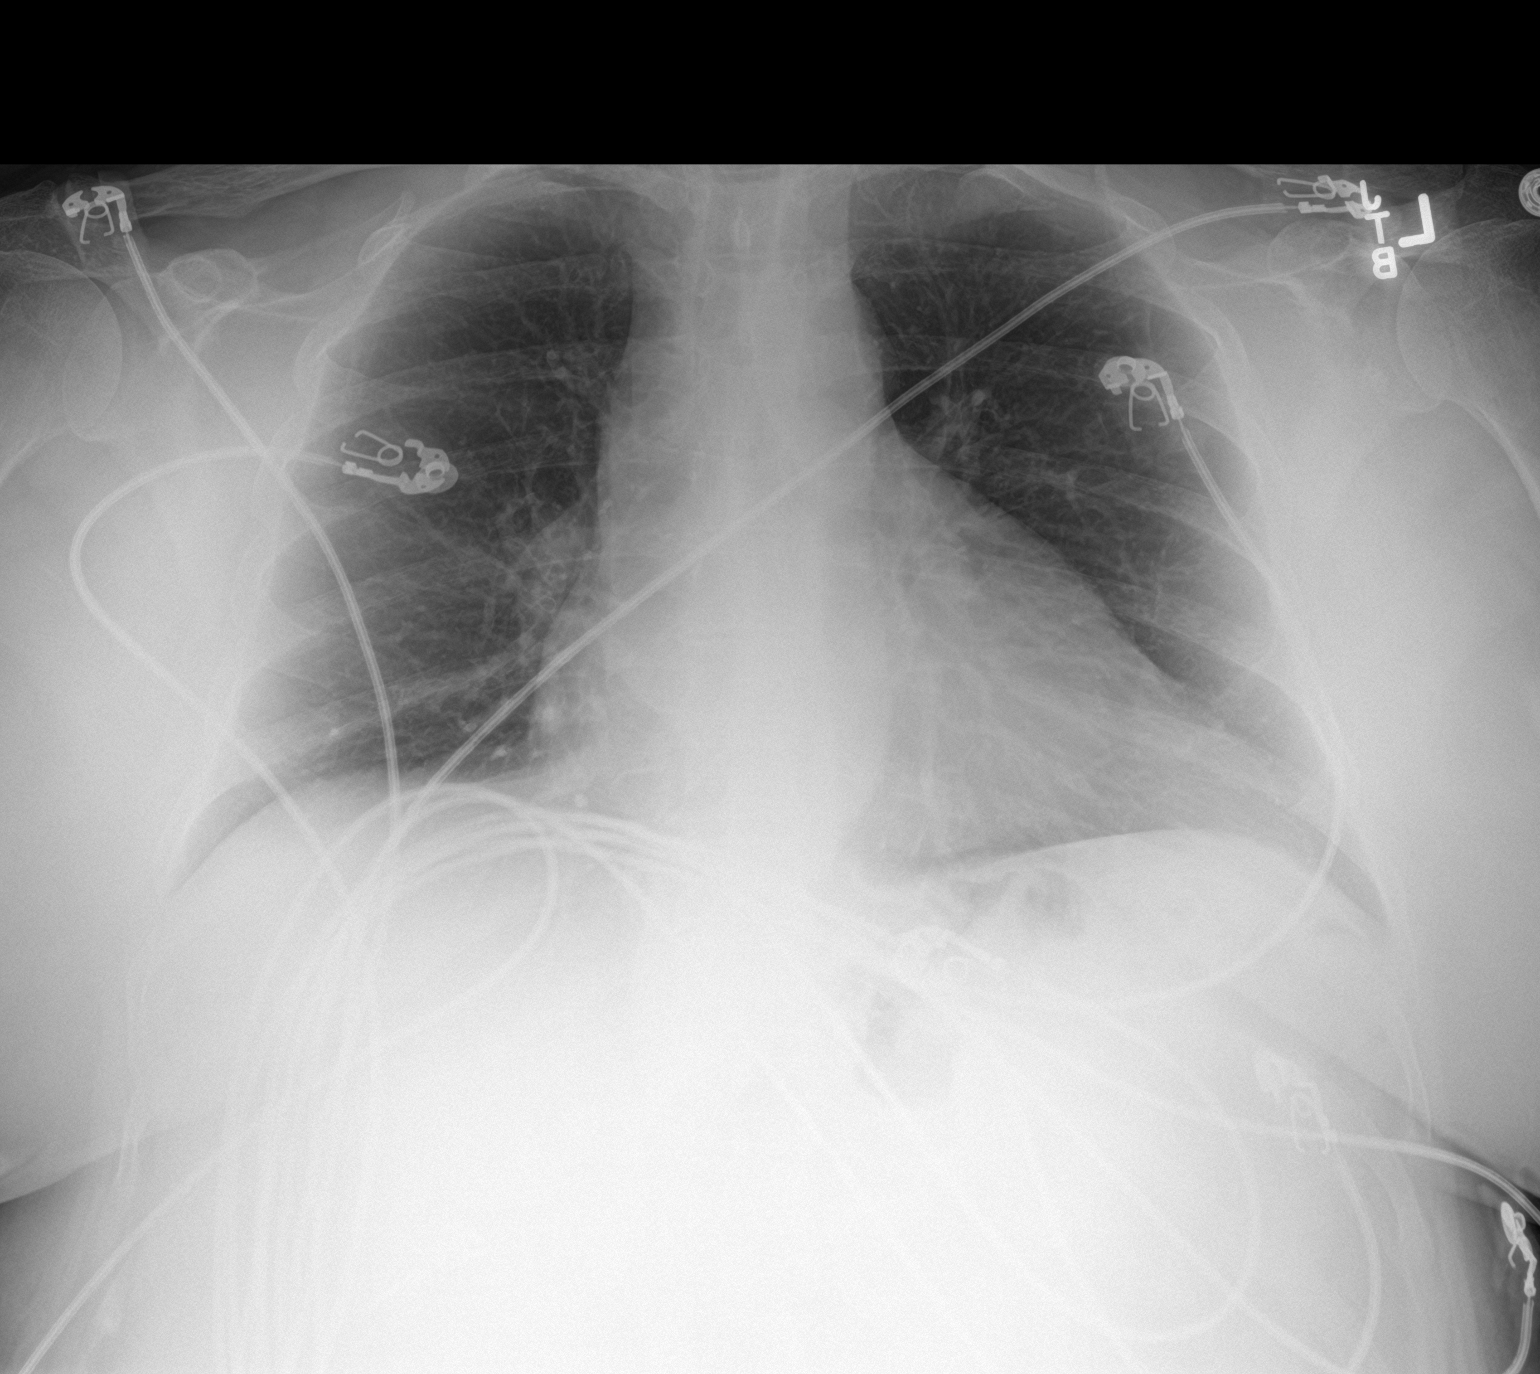

[2 of 2 positions shown; findings below may reference images not displayed]

FINDINGS: Lordotic positioning on the AP view. Allowing for this, the heart
size and mediastinal contours are stable. The lungs are clear. There
is no pleural effusion or pneumothorax. No acute osseous findings
are demonstrated. Telemetry leads overlie the chest.
IMPRESSION: Stable chest.  No active cardiopulmonary process.

## 2021-04-05 ENCOUNTER — Ambulatory Visit (INDEPENDENT_AMBULATORY_CARE_PROVIDER_SITE_OTHER): Payer: BC Managed Care – PPO | Admitting: Podiatrist

## 2021-04-05 ENCOUNTER — Other Ambulatory Visit: Payer: Self-pay

## 2021-04-05 ENCOUNTER — Encounter: Payer: Self-pay | Admitting: Podiatrist

## 2021-04-05 DIAGNOSIS — L600 Ingrowing nail: Secondary | ICD-10-CM

## 2021-04-05 NOTE — Patient Instructions (Signed)
Soak Instructions    THE DAY AFTER THE PROCEDURE  Place 1/4 cup of epsom salts in a quart of warm tap water.  Submerge your foot or feet with outer bandage intact for the initial soak; this will allow the bandage to become moist and wet for easy lift off.  Once you remove your bandage, continue to soak in the solution for 20 minutes.  This soak should be done twice a day.  Next, remove your foot or feet from solution, blot dry the affected area and cover.  Apply polysporin or neosporin.   You may use a band aid large enough to cover the area or use gauze and tape.     IF YOUR SKIN BECOMES IRRITATED WHILE USING THESE INSTRUCTIONS, IT IS OKAY TO SWITCH TO  antibacterial soap pump soap (Dial)  and water to keep the toe clean instead of soaking in epsom salts.   Long Term Care Instructions-Post Nail Surgery  You have had your ingrown toenail and root treated with a chemical.  This chemical causes a burn that will drain and ooze like a blister.  This can drain for 6-8 weeks or longer.  It is important to keep this area clean, covered, and follow the soaking instructions dispensed at the time of your surgery.  This area will eventually dry and form a scab.  Once the scab forms you no longer need to soak or apply a dressing.  If at any time you experience an increase in pain, redness, swelling, or drainage, you should contact the office as soon as possible. 

## 2021-04-05 NOTE — Progress Notes (Signed)
HPI: Patient is 70 y.o. female who presents today for pain along both borders of bilateral great toenails.  She states in the past she had nail borders removed.  She is now having trouble with both borders on both great toenails.  She relates she gets pedicures regularly and has to dig down into the sides of the toenails to get the painful nail spicules out.     Patient Active Problem List   Diagnosis Date Noted   Postoperative hypothyroidism 12/25/2017   Vitamin D deficiency 12/20/2017   DDD (degenerative disc disease), cervical 11/16/2017   GERD (gastroesophageal reflux disease) 05/08/2017   Hypertension 05/08/2017   Migraine 05/08/2017   Osteopenia 05/08/2017   Gastric polyps 05/08/2017   GAD (generalized anxiety disorder) 05/08/2017   Seasonal allergic rhinitis due to pollen 05/08/2017   Recurrent nephrolithiasis 05/08/2017   Major depressive disorder, recurrent episode, moderate (HCC) 11/23/2012   Moderate episode of recurrent major depressive disorder (HCC) 11/23/2012   Peptic ulcer 10/30/2012   S/P hysterectomy 01/13/2012   Anxiety    Thyroid disease    Menopause    Renal calculi    Fatty liver     Current Outpatient Medications on File Prior to Visit  Medication Sig Dispense Refill   desvenlafaxine (PRISTIQ) 100 MG 24 hr tablet Take by mouth.     dexlansoprazole (DEXILANT) 60 MG capsule Take 60 mg by mouth daily.     fluticasone (FLONASE) 50 MCG/ACT nasal spray Place 1 spray into both nostrils daily as needed for allergies.      furosemide (LASIX) 20 MG tablet Take 20 mg by mouth daily.  3   levocetirizine (XYZAL) 5 MG tablet Take 5 mg by mouth daily.     levothyroxine (SYNTHROID, LEVOTHROID) 112 MCG tablet Take 1 tablet by mouth daily.  5   liothyronine (CYTOMEL) 5 MCG tablet Take 1 tablet by mouth daily.     losartan (COZAAR) 50 MG tablet Take 1 tablet by mouth daily.     naproxen (NAPROSYN) 500 MG tablet Take 1 tablet (500 mg total) by mouth 2 (two) times daily  with a meal. 30 tablet 0   NATURE-THROID 97.5 MG TABS Take 2 tablets by mouth every morning.  6   phenazopyridine (PYRIDIUM) 200 MG tablet Take 200 mg by mouth daily as needed for pain.     Phenyleph-CPM-DM-Aspirin (ALKA-SELTZER PLUS COLD & COUGH PO) Take 2 tablets by mouth 2 (two) times daily as needed (sinuses).     promethazine (PHENERGAN) 12.5 MG tablet Take 12.5 mg by mouth every 8 (eight) hours as needed for nausea or vomiting.      traMADol (ULTRAM) 50 MG tablet Take 50 mg by mouth 3 (three) times daily as needed.  0   No current facility-administered medications on file prior to visit.    Allergies  Allergen Reactions   Codeine Other (See Comments)    Nightmares when taking tablets.   Nightmares when taking tablets.     Sulfa Antibiotics Nausea And Vomiting and Nausea Only    Review of Systems No fevers, chills, nausea, muscle aches, no difficulty breathing, no calf pain, no chest pain or shortness of breath.   Physical Exam  GENERAL APPEARANCE: Alert, conversant. Appropriately groomed. No acute distress.   VASCULAR: Pedal pulses palpable DP and PT bilateral.  Capillary refill time is immediate to all digits,  Proximal to distal cooling it warm to warm.  Digital perfusion adequate.   NEUROLOGIC: sensation is intact to 5.07 monofilament at  5/5 sites bilateral.  Light touch is intact bilateral, vibratory sensation intact bilateral  MUSCULOSKELETAL: acceptable muscle strength, tone and stability bilateral.  No gross boney pedal deformities noted.  No pain, crepitus or limitation noted with foot and ankle range of motion bilateral.   DERMATOLOGIC: skin is warm, supple, and dry.  No open lesions noted.  No rash, no pre ulcerative lesions.  Great toenails have incurvated medial and lateral toenail borders bilateral.  Pain with direct pressure is noted.  No redness, no swelling, no sign of infection is noted.  Ingrown nail deformity seen.    Assessment   1. Ingrown toenail of  both feet      Plan  Treatment options and alternatives were discussed with the patient.  Discussed a permanent removal of the medial and lateral nail borders of bilateral great toenails.  At today's visit the patient would like to have this performed.  I did agree and I prepped the skin with alcohol and infiltrated lidocaine Marcaine mixture in a digital block fashion to anesthetize the toes.  The toes were then prepped with Betadine and exsanguinated and the medial and lateral nail borders of bilateral great toenails were then sharply removed from distal to proximal exposing the matrix tissue.  Phenol was applied to the matrix tissue followed by an alcohol wash and antibiotic ointment and a dry and compressive sterile dressing was then applied.  The patient was given instructions on soaks and aftercare and was instructed to call if she has any redness, swelling, drainage or concerns of any kind.  Patient demonstrates an understanding of this call if any problems arise.

## 2021-09-21 DIAGNOSIS — E119 Type 2 diabetes mellitus without complications: Secondary | ICD-10-CM | POA: Diagnosis not present

## 2021-09-21 DIAGNOSIS — E890 Postprocedural hypothyroidism: Secondary | ICD-10-CM | POA: Diagnosis not present

## 2021-12-07 DIAGNOSIS — L821 Other seborrheic keratosis: Secondary | ICD-10-CM | POA: Diagnosis not present

## 2021-12-07 DIAGNOSIS — L0201 Cutaneous abscess of face: Secondary | ICD-10-CM | POA: Diagnosis not present

## 2022-01-04 DIAGNOSIS — E890 Postprocedural hypothyroidism: Secondary | ICD-10-CM | POA: Diagnosis not present

## 2022-01-04 DIAGNOSIS — E119 Type 2 diabetes mellitus without complications: Secondary | ICD-10-CM | POA: Diagnosis not present

## 2022-01-17 DIAGNOSIS — J0140 Acute pansinusitis, unspecified: Secondary | ICD-10-CM | POA: Diagnosis not present

## 2022-03-09 DIAGNOSIS — K2270 Barrett's esophagus without dysplasia: Secondary | ICD-10-CM | POA: Diagnosis not present

## 2022-03-09 DIAGNOSIS — K219 Gastro-esophageal reflux disease without esophagitis: Secondary | ICD-10-CM | POA: Diagnosis not present

## 2022-04-20 DIAGNOSIS — F411 Generalized anxiety disorder: Secondary | ICD-10-CM | POA: Diagnosis not present

## 2022-04-28 DIAGNOSIS — F411 Generalized anxiety disorder: Secondary | ICD-10-CM | POA: Diagnosis not present

## 2022-04-28 DIAGNOSIS — E1165 Type 2 diabetes mellitus with hyperglycemia: Secondary | ICD-10-CM | POA: Diagnosis not present

## 2022-04-28 DIAGNOSIS — E069 Thyroiditis, unspecified: Secondary | ICD-10-CM | POA: Diagnosis not present

## 2022-05-11 DIAGNOSIS — J0140 Acute pansinusitis, unspecified: Secondary | ICD-10-CM | POA: Diagnosis not present

## 2022-05-24 DIAGNOSIS — J3089 Other allergic rhinitis: Secondary | ICD-10-CM | POA: Diagnosis not present

## 2022-06-10 DIAGNOSIS — R42 Dizziness and giddiness: Secondary | ICD-10-CM | POA: Diagnosis not present

## 2022-07-06 DIAGNOSIS — E890 Postprocedural hypothyroidism: Secondary | ICD-10-CM | POA: Diagnosis not present

## 2022-07-06 DIAGNOSIS — E119 Type 2 diabetes mellitus without complications: Secondary | ICD-10-CM | POA: Diagnosis not present

## 2022-07-12 DIAGNOSIS — R059 Cough, unspecified: Secondary | ICD-10-CM | POA: Diagnosis not present

## 2022-07-12 DIAGNOSIS — J0100 Acute maxillary sinusitis, unspecified: Secondary | ICD-10-CM | POA: Diagnosis not present

## 2022-08-23 DIAGNOSIS — J020 Streptococcal pharyngitis: Secondary | ICD-10-CM | POA: Diagnosis not present

## 2022-09-05 DIAGNOSIS — H6993 Unspecified Eustachian tube disorder, bilateral: Secondary | ICD-10-CM | POA: Diagnosis not present

## 2022-09-05 DIAGNOSIS — R070 Pain in throat: Secondary | ICD-10-CM | POA: Diagnosis not present

## 2022-09-05 DIAGNOSIS — D508 Other iron deficiency anemias: Secondary | ICD-10-CM | POA: Diagnosis not present

## 2022-09-14 DIAGNOSIS — R197 Diarrhea, unspecified: Secondary | ICD-10-CM | POA: Diagnosis not present

## 2022-09-16 DIAGNOSIS — N3289 Other specified disorders of bladder: Secondary | ICD-10-CM | POA: Diagnosis not present

## 2022-09-16 DIAGNOSIS — Z881 Allergy status to other antibiotic agents status: Secondary | ICD-10-CM | POA: Diagnosis not present

## 2022-09-16 DIAGNOSIS — R110 Nausea: Secondary | ICD-10-CM | POA: Diagnosis not present

## 2022-09-16 DIAGNOSIS — Z9049 Acquired absence of other specified parts of digestive tract: Secondary | ICD-10-CM | POA: Diagnosis not present

## 2022-09-16 DIAGNOSIS — R197 Diarrhea, unspecified: Secondary | ICD-10-CM | POA: Diagnosis not present

## 2022-09-16 DIAGNOSIS — N281 Cyst of kidney, acquired: Secondary | ICD-10-CM | POA: Diagnosis not present

## 2022-09-16 DIAGNOSIS — Z888 Allergy status to other drugs, medicaments and biological substances status: Secondary | ICD-10-CM | POA: Diagnosis not present

## 2022-09-16 DIAGNOSIS — R1084 Generalized abdominal pain: Secondary | ICD-10-CM | POA: Diagnosis not present

## 2022-09-16 DIAGNOSIS — Z79890 Hormone replacement therapy (postmenopausal): Secondary | ICD-10-CM | POA: Diagnosis not present

## 2022-09-16 DIAGNOSIS — E890 Postprocedural hypothyroidism: Secondary | ICD-10-CM | POA: Diagnosis not present

## 2022-09-16 DIAGNOSIS — I10 Essential (primary) hypertension: Secondary | ICD-10-CM | POA: Diagnosis not present

## 2022-09-16 DIAGNOSIS — K5730 Diverticulosis of large intestine without perforation or abscess without bleeding: Secondary | ICD-10-CM | POA: Diagnosis not present

## 2022-09-16 DIAGNOSIS — Z79899 Other long term (current) drug therapy: Secondary | ICD-10-CM | POA: Diagnosis not present

## 2022-09-16 DIAGNOSIS — Z882 Allergy status to sulfonamides status: Secondary | ICD-10-CM | POA: Diagnosis not present

## 2022-09-16 DIAGNOSIS — R000 Tachycardia, unspecified: Secondary | ICD-10-CM | POA: Diagnosis not present

## 2022-09-16 DIAGNOSIS — Z885 Allergy status to narcotic agent status: Secondary | ICD-10-CM | POA: Diagnosis not present

## 2022-09-16 DIAGNOSIS — R10817 Generalized abdominal tenderness: Secondary | ICD-10-CM | POA: Diagnosis not present

## 2022-09-20 DIAGNOSIS — R197 Diarrhea, unspecified: Secondary | ICD-10-CM | POA: Diagnosis not present

## 2022-09-26 DIAGNOSIS — R197 Diarrhea, unspecified: Secondary | ICD-10-CM | POA: Diagnosis not present

## 2022-09-26 DIAGNOSIS — K219 Gastro-esophageal reflux disease without esophagitis: Secondary | ICD-10-CM | POA: Diagnosis not present

## 2022-10-18 DIAGNOSIS — K2270 Barrett's esophagus without dysplasia: Secondary | ICD-10-CM | POA: Diagnosis not present

## 2022-10-18 DIAGNOSIS — K2289 Other specified disease of esophagus: Secondary | ICD-10-CM | POA: Diagnosis not present

## 2022-10-20 DIAGNOSIS — R5383 Other fatigue: Secondary | ICD-10-CM | POA: Diagnosis not present

## 2022-10-20 DIAGNOSIS — F411 Generalized anxiety disorder: Secondary | ICD-10-CM | POA: Diagnosis not present

## 2022-10-20 DIAGNOSIS — F410 Panic disorder [episodic paroxysmal anxiety] without agoraphobia: Secondary | ICD-10-CM | POA: Diagnosis not present

## 2022-10-26 DIAGNOSIS — E119 Type 2 diabetes mellitus without complications: Secondary | ICD-10-CM | POA: Diagnosis not present

## 2022-10-26 DIAGNOSIS — E890 Postprocedural hypothyroidism: Secondary | ICD-10-CM | POA: Diagnosis not present

## 2022-11-04 DIAGNOSIS — K760 Fatty (change of) liver, not elsewhere classified: Secondary | ICD-10-CM | POA: Diagnosis not present

## 2022-11-04 DIAGNOSIS — E890 Postprocedural hypothyroidism: Secondary | ICD-10-CM | POA: Diagnosis not present

## 2022-11-04 DIAGNOSIS — N1832 Chronic kidney disease, stage 3b: Secondary | ICD-10-CM | POA: Diagnosis not present

## 2022-11-04 DIAGNOSIS — D729 Disorder of white blood cells, unspecified: Secondary | ICD-10-CM | POA: Diagnosis not present

## 2022-11-04 DIAGNOSIS — E611 Iron deficiency: Secondary | ICD-10-CM | POA: Diagnosis not present

## 2022-11-04 DIAGNOSIS — I10 Essential (primary) hypertension: Secondary | ICD-10-CM | POA: Diagnosis not present

## 2022-11-04 DIAGNOSIS — N200 Calculus of kidney: Secondary | ICD-10-CM | POA: Diagnosis not present

## 2022-11-04 DIAGNOSIS — Z79899 Other long term (current) drug therapy: Secondary | ICD-10-CM | POA: Diagnosis not present

## 2022-11-04 DIAGNOSIS — D649 Anemia, unspecified: Secondary | ICD-10-CM | POA: Diagnosis not present

## 2022-11-04 DIAGNOSIS — E559 Vitamin D deficiency, unspecified: Secondary | ICD-10-CM | POA: Diagnosis not present

## 2022-11-04 DIAGNOSIS — D75839 Thrombocytosis, unspecified: Secondary | ICD-10-CM | POA: Diagnosis not present

## 2022-11-09 DIAGNOSIS — D509 Iron deficiency anemia, unspecified: Secondary | ICD-10-CM | POA: Diagnosis not present

## 2022-11-17 DIAGNOSIS — R1032 Left lower quadrant pain: Secondary | ICD-10-CM | POA: Diagnosis not present

## 2022-11-17 DIAGNOSIS — R300 Dysuria: Secondary | ICD-10-CM | POA: Diagnosis not present

## 2022-11-17 DIAGNOSIS — R3982 Chronic bladder pain: Secondary | ICD-10-CM | POA: Diagnosis not present

## 2022-11-23 DIAGNOSIS — D509 Iron deficiency anemia, unspecified: Secondary | ICD-10-CM | POA: Diagnosis not present

## 2022-12-02 DIAGNOSIS — M778 Other enthesopathies, not elsewhere classified: Secondary | ICD-10-CM | POA: Diagnosis not present

## 2022-12-02 DIAGNOSIS — F410 Panic disorder [episodic paroxysmal anxiety] without agoraphobia: Secondary | ICD-10-CM | POA: Diagnosis not present

## 2022-12-13 DIAGNOSIS — M5412 Radiculopathy, cervical region: Secondary | ICD-10-CM | POA: Diagnosis not present

## 2022-12-13 DIAGNOSIS — M5136 Other intervertebral disc degeneration, lumbar region: Secondary | ICD-10-CM | POA: Diagnosis not present

## 2022-12-13 DIAGNOSIS — M5030 Other cervical disc degeneration, unspecified cervical region: Secondary | ICD-10-CM | POA: Diagnosis not present

## 2022-12-27 DIAGNOSIS — M5416 Radiculopathy, lumbar region: Secondary | ICD-10-CM | POA: Diagnosis not present

## 2023-01-02 DIAGNOSIS — R079 Chest pain, unspecified: Secondary | ICD-10-CM | POA: Diagnosis not present

## 2023-01-02 DIAGNOSIS — F411 Generalized anxiety disorder: Secondary | ICD-10-CM | POA: Diagnosis not present

## 2023-01-11 DIAGNOSIS — E890 Postprocedural hypothyroidism: Secondary | ICD-10-CM | POA: Diagnosis not present

## 2023-01-11 DIAGNOSIS — E119 Type 2 diabetes mellitus without complications: Secondary | ICD-10-CM | POA: Diagnosis not present

## 2023-01-11 DIAGNOSIS — I10 Essential (primary) hypertension: Secondary | ICD-10-CM | POA: Diagnosis not present

## 2023-01-19 DIAGNOSIS — K2270 Barrett's esophagus without dysplasia: Secondary | ICD-10-CM | POA: Diagnosis not present

## 2023-01-19 DIAGNOSIS — K588 Other irritable bowel syndrome: Secondary | ICD-10-CM | POA: Diagnosis not present

## 2023-01-19 DIAGNOSIS — K219 Gastro-esophageal reflux disease without esophagitis: Secondary | ICD-10-CM | POA: Diagnosis not present

## 2023-01-19 DIAGNOSIS — K5909 Other constipation: Secondary | ICD-10-CM | POA: Diagnosis not present

## 2023-02-04 DIAGNOSIS — Z1231 Encounter for screening mammogram for malignant neoplasm of breast: Secondary | ICD-10-CM | POA: Diagnosis not present

## 2023-02-06 DIAGNOSIS — F332 Major depressive disorder, recurrent severe without psychotic features: Secondary | ICD-10-CM | POA: Diagnosis not present

## 2023-02-06 DIAGNOSIS — F411 Generalized anxiety disorder: Secondary | ICD-10-CM | POA: Diagnosis not present

## 2023-03-17 DIAGNOSIS — I10 Essential (primary) hypertension: Secondary | ICD-10-CM | POA: Diagnosis not present

## 2023-03-17 DIAGNOSIS — R079 Chest pain, unspecified: Secondary | ICD-10-CM | POA: Diagnosis not present

## 2023-03-21 DIAGNOSIS — R079 Chest pain, unspecified: Secondary | ICD-10-CM | POA: Diagnosis not present

## 2023-03-29 DIAGNOSIS — J0140 Acute pansinusitis, unspecified: Secondary | ICD-10-CM | POA: Diagnosis not present

## 2023-03-29 DIAGNOSIS — F411 Generalized anxiety disorder: Secondary | ICD-10-CM | POA: Diagnosis not present

## 2023-04-03 DIAGNOSIS — F411 Generalized anxiety disorder: Secondary | ICD-10-CM | POA: Diagnosis not present

## 2023-04-03 DIAGNOSIS — F332 Major depressive disorder, recurrent severe without psychotic features: Secondary | ICD-10-CM | POA: Diagnosis not present

## 2023-04-11 DIAGNOSIS — R52 Pain, unspecified: Secondary | ICD-10-CM | POA: Diagnosis not present

## 2023-04-11 DIAGNOSIS — J0141 Acute recurrent pansinusitis: Secondary | ICD-10-CM | POA: Diagnosis not present

## 2023-05-01 DIAGNOSIS — E890 Postprocedural hypothyroidism: Secondary | ICD-10-CM | POA: Diagnosis not present

## 2023-05-01 DIAGNOSIS — I10 Essential (primary) hypertension: Secondary | ICD-10-CM | POA: Diagnosis not present

## 2023-05-01 DIAGNOSIS — E119 Type 2 diabetes mellitus without complications: Secondary | ICD-10-CM | POA: Diagnosis not present

## 2023-05-03 DIAGNOSIS — F411 Generalized anxiety disorder: Secondary | ICD-10-CM | POA: Diagnosis not present

## 2023-05-03 DIAGNOSIS — F332 Major depressive disorder, recurrent severe without psychotic features: Secondary | ICD-10-CM | POA: Diagnosis not present

## 2023-05-12 DIAGNOSIS — K5909 Other constipation: Secondary | ICD-10-CM | POA: Diagnosis not present

## 2023-05-12 DIAGNOSIS — K588 Other irritable bowel syndrome: Secondary | ICD-10-CM | POA: Diagnosis not present

## 2023-05-12 DIAGNOSIS — K219 Gastro-esophageal reflux disease without esophagitis: Secondary | ICD-10-CM | POA: Diagnosis not present

## 2023-05-12 DIAGNOSIS — K2270 Barrett's esophagus without dysplasia: Secondary | ICD-10-CM | POA: Diagnosis not present

## 2023-05-31 DIAGNOSIS — K2270 Barrett's esophagus without dysplasia: Secondary | ICD-10-CM | POA: Diagnosis not present

## 2023-05-31 DIAGNOSIS — R197 Diarrhea, unspecified: Secondary | ICD-10-CM | POA: Diagnosis not present

## 2023-05-31 DIAGNOSIS — K219 Gastro-esophageal reflux disease without esophagitis: Secondary | ICD-10-CM | POA: Diagnosis not present

## 2023-05-31 DIAGNOSIS — R1084 Generalized abdominal pain: Secondary | ICD-10-CM | POA: Diagnosis not present

## 2023-06-19 DIAGNOSIS — R5383 Other fatigue: Secondary | ICD-10-CM | POA: Diagnosis not present

## 2023-06-19 DIAGNOSIS — R42 Dizziness and giddiness: Secondary | ICD-10-CM | POA: Diagnosis not present

## 2023-06-19 DIAGNOSIS — M778 Other enthesopathies, not elsewhere classified: Secondary | ICD-10-CM | POA: Diagnosis not present

## 2023-06-19 DIAGNOSIS — M79662 Pain in left lower leg: Secondary | ICD-10-CM | POA: Diagnosis not present

## 2023-06-19 DIAGNOSIS — H6993 Unspecified Eustachian tube disorder, bilateral: Secondary | ICD-10-CM | POA: Diagnosis not present

## 2023-06-26 DIAGNOSIS — J0140 Acute pansinusitis, unspecified: Secondary | ICD-10-CM | POA: Diagnosis not present

## 2023-07-13 DIAGNOSIS — J0140 Acute pansinusitis, unspecified: Secondary | ICD-10-CM | POA: Diagnosis not present

## 2023-07-27 DIAGNOSIS — F332 Major depressive disorder, recurrent severe without psychotic features: Secondary | ICD-10-CM | POA: Diagnosis not present

## 2023-07-27 DIAGNOSIS — F411 Generalized anxiety disorder: Secondary | ICD-10-CM | POA: Diagnosis not present

## 2023-08-22 DIAGNOSIS — F411 Generalized anxiety disorder: Secondary | ICD-10-CM | POA: Diagnosis not present

## 2023-08-22 DIAGNOSIS — F332 Major depressive disorder, recurrent severe without psychotic features: Secondary | ICD-10-CM | POA: Diagnosis not present

## 2023-08-29 DIAGNOSIS — J0140 Acute pansinusitis, unspecified: Secondary | ICD-10-CM | POA: Diagnosis not present

## 2023-09-06 DIAGNOSIS — F411 Generalized anxiety disorder: Secondary | ICD-10-CM | POA: Diagnosis not present

## 2023-09-06 DIAGNOSIS — F332 Major depressive disorder, recurrent severe without psychotic features: Secondary | ICD-10-CM | POA: Diagnosis not present

## 2023-09-12 DIAGNOSIS — F411 Generalized anxiety disorder: Secondary | ICD-10-CM | POA: Diagnosis not present

## 2023-09-12 DIAGNOSIS — F332 Major depressive disorder, recurrent severe without psychotic features: Secondary | ICD-10-CM | POA: Diagnosis not present

## 2023-09-15 DIAGNOSIS — J0140 Acute pansinusitis, unspecified: Secondary | ICD-10-CM | POA: Diagnosis not present

## 2023-09-19 DIAGNOSIS — R52 Pain, unspecified: Secondary | ICD-10-CM | POA: Diagnosis not present

## 2023-09-28 DIAGNOSIS — K219 Gastro-esophageal reflux disease without esophagitis: Secondary | ICD-10-CM | POA: Diagnosis not present

## 2023-09-28 DIAGNOSIS — K2270 Barrett's esophagus without dysplasia: Secondary | ICD-10-CM | POA: Diagnosis not present

## 2023-09-28 DIAGNOSIS — R1084 Generalized abdominal pain: Secondary | ICD-10-CM | POA: Diagnosis not present

## 2023-09-28 DIAGNOSIS — R197 Diarrhea, unspecified: Secondary | ICD-10-CM | POA: Diagnosis not present

## 2023-10-04 DIAGNOSIS — F332 Major depressive disorder, recurrent severe without psychotic features: Secondary | ICD-10-CM | POA: Diagnosis not present

## 2023-10-04 DIAGNOSIS — F411 Generalized anxiety disorder: Secondary | ICD-10-CM | POA: Diagnosis not present

## 2023-10-08 DIAGNOSIS — K219 Gastro-esophageal reflux disease without esophagitis: Secondary | ICD-10-CM | POA: Diagnosis not present

## 2023-10-08 DIAGNOSIS — R197 Diarrhea, unspecified: Secondary | ICD-10-CM | POA: Diagnosis not present

## 2023-10-08 DIAGNOSIS — R1084 Generalized abdominal pain: Secondary | ICD-10-CM | POA: Diagnosis not present

## 2023-10-09 DIAGNOSIS — K219 Gastro-esophageal reflux disease without esophagitis: Secondary | ICD-10-CM | POA: Diagnosis not present

## 2023-10-09 DIAGNOSIS — R197 Diarrhea, unspecified: Secondary | ICD-10-CM | POA: Diagnosis not present

## 2023-10-09 DIAGNOSIS — R1084 Generalized abdominal pain: Secondary | ICD-10-CM | POA: Diagnosis not present

## 2023-10-20 DIAGNOSIS — I10 Essential (primary) hypertension: Secondary | ICD-10-CM | POA: Diagnosis not present

## 2023-10-31 DIAGNOSIS — E890 Postprocedural hypothyroidism: Secondary | ICD-10-CM | POA: Diagnosis not present

## 2023-10-31 DIAGNOSIS — E119 Type 2 diabetes mellitus without complications: Secondary | ICD-10-CM | POA: Diagnosis not present

## 2023-11-06 DIAGNOSIS — R7989 Other specified abnormal findings of blood chemistry: Secondary | ICD-10-CM | POA: Diagnosis not present

## 2023-11-06 DIAGNOSIS — R790 Abnormal level of blood mineral: Secondary | ICD-10-CM | POA: Diagnosis not present

## 2023-11-09 DIAGNOSIS — F411 Generalized anxiety disorder: Secondary | ICD-10-CM | POA: Diagnosis not present

## 2023-11-09 DIAGNOSIS — F332 Major depressive disorder, recurrent severe without psychotic features: Secondary | ICD-10-CM | POA: Diagnosis not present

## 2023-11-10 DIAGNOSIS — R051 Acute cough: Secondary | ICD-10-CM | POA: Diagnosis not present

## 2023-12-13 DIAGNOSIS — F411 Generalized anxiety disorder: Secondary | ICD-10-CM | POA: Diagnosis not present

## 2023-12-13 DIAGNOSIS — F332 Major depressive disorder, recurrent severe without psychotic features: Secondary | ICD-10-CM | POA: Diagnosis not present

## 2024-01-09 DIAGNOSIS — F332 Major depressive disorder, recurrent severe without psychotic features: Secondary | ICD-10-CM | POA: Diagnosis not present

## 2024-01-09 DIAGNOSIS — F411 Generalized anxiety disorder: Secondary | ICD-10-CM | POA: Diagnosis not present

## 2024-01-25 DIAGNOSIS — Z13220 Encounter for screening for lipoid disorders: Secondary | ICD-10-CM | POA: Diagnosis not present

## 2024-01-25 DIAGNOSIS — Z0000 Encounter for general adult medical examination without abnormal findings: Secondary | ICD-10-CM | POA: Diagnosis not present

## 2024-01-25 DIAGNOSIS — Z1329 Encounter for screening for other suspected endocrine disorder: Secondary | ICD-10-CM | POA: Diagnosis not present

## 2024-01-25 DIAGNOSIS — R7989 Other specified abnormal findings of blood chemistry: Secondary | ICD-10-CM | POA: Diagnosis not present

## 2024-02-08 DIAGNOSIS — F411 Generalized anxiety disorder: Secondary | ICD-10-CM | POA: Diagnosis not present

## 2024-02-08 DIAGNOSIS — H6993 Unspecified Eustachian tube disorder, bilateral: Secondary | ICD-10-CM | POA: Diagnosis not present

## 2024-02-08 DIAGNOSIS — F332 Major depressive disorder, recurrent severe without psychotic features: Secondary | ICD-10-CM | POA: Diagnosis not present

## 2024-02-14 DIAGNOSIS — F332 Major depressive disorder, recurrent severe without psychotic features: Secondary | ICD-10-CM | POA: Diagnosis not present

## 2024-02-14 DIAGNOSIS — F411 Generalized anxiety disorder: Secondary | ICD-10-CM | POA: Diagnosis not present

## 2024-02-23 DIAGNOSIS — L578 Other skin changes due to chronic exposure to nonionizing radiation: Secondary | ICD-10-CM | POA: Diagnosis not present

## 2024-02-23 DIAGNOSIS — L814 Other melanin hyperpigmentation: Secondary | ICD-10-CM | POA: Diagnosis not present

## 2024-02-23 DIAGNOSIS — D225 Melanocytic nevi of trunk: Secondary | ICD-10-CM | POA: Diagnosis not present

## 2024-02-23 DIAGNOSIS — L820 Inflamed seborrheic keratosis: Secondary | ICD-10-CM | POA: Diagnosis not present

## 2024-02-23 DIAGNOSIS — L649 Androgenic alopecia, unspecified: Secondary | ICD-10-CM | POA: Diagnosis not present

## 2024-02-26 DIAGNOSIS — F332 Major depressive disorder, recurrent severe without psychotic features: Secondary | ICD-10-CM | POA: Diagnosis not present

## 2024-02-29 DIAGNOSIS — F332 Major depressive disorder, recurrent severe without psychotic features: Secondary | ICD-10-CM | POA: Diagnosis not present

## 2024-03-01 DIAGNOSIS — F332 Major depressive disorder, recurrent severe without psychotic features: Secondary | ICD-10-CM | POA: Diagnosis not present

## 2024-03-04 DIAGNOSIS — F332 Major depressive disorder, recurrent severe without psychotic features: Secondary | ICD-10-CM | POA: Diagnosis not present

## 2024-03-07 DIAGNOSIS — G4700 Insomnia, unspecified: Secondary | ICD-10-CM | POA: Diagnosis not present

## 2024-03-07 DIAGNOSIS — R110 Nausea: Secondary | ICD-10-CM | POA: Diagnosis not present

## 2024-03-07 DIAGNOSIS — J3089 Other allergic rhinitis: Secondary | ICD-10-CM | POA: Diagnosis not present

## 2024-03-07 DIAGNOSIS — R5383 Other fatigue: Secondary | ICD-10-CM | POA: Diagnosis not present

## 2024-03-08 DIAGNOSIS — F332 Major depressive disorder, recurrent severe without psychotic features: Secondary | ICD-10-CM | POA: Diagnosis not present

## 2024-03-12 DIAGNOSIS — F332 Major depressive disorder, recurrent severe without psychotic features: Secondary | ICD-10-CM | POA: Diagnosis not present

## 2024-03-13 DIAGNOSIS — Z1231 Encounter for screening mammogram for malignant neoplasm of breast: Secondary | ICD-10-CM | POA: Diagnosis not present

## 2024-03-13 DIAGNOSIS — Z6832 Body mass index (BMI) 32.0-32.9, adult: Secondary | ICD-10-CM | POA: Diagnosis not present

## 2024-03-13 DIAGNOSIS — D75839 Thrombocytosis, unspecified: Secondary | ICD-10-CM | POA: Diagnosis not present

## 2024-03-13 DIAGNOSIS — E6609 Other obesity due to excess calories: Secondary | ICD-10-CM | POA: Diagnosis not present

## 2024-03-13 DIAGNOSIS — F331 Major depressive disorder, recurrent, moderate: Secondary | ICD-10-CM | POA: Diagnosis not present

## 2024-03-13 DIAGNOSIS — D72828 Other elevated white blood cell count: Secondary | ICD-10-CM | POA: Diagnosis not present

## 2024-03-13 DIAGNOSIS — E66811 Obesity, class 1: Secondary | ICD-10-CM | POA: Diagnosis not present

## 2024-03-13 DIAGNOSIS — D509 Iron deficiency anemia, unspecified: Secondary | ICD-10-CM | POA: Diagnosis not present

## 2024-03-15 DIAGNOSIS — F332 Major depressive disorder, recurrent severe without psychotic features: Secondary | ICD-10-CM | POA: Diagnosis not present

## 2024-03-15 DIAGNOSIS — E890 Postprocedural hypothyroidism: Secondary | ICD-10-CM | POA: Diagnosis not present

## 2024-03-18 DIAGNOSIS — F332 Major depressive disorder, recurrent severe without psychotic features: Secondary | ICD-10-CM | POA: Diagnosis not present

## 2024-03-18 DIAGNOSIS — R1084 Generalized abdominal pain: Secondary | ICD-10-CM | POA: Diagnosis not present

## 2024-03-18 DIAGNOSIS — K219 Gastro-esophageal reflux disease without esophagitis: Secondary | ICD-10-CM | POA: Diagnosis not present

## 2024-03-18 DIAGNOSIS — K588 Other irritable bowel syndrome: Secondary | ICD-10-CM | POA: Diagnosis not present

## 2024-03-18 DIAGNOSIS — R197 Diarrhea, unspecified: Secondary | ICD-10-CM | POA: Diagnosis not present

## 2024-03-20 DIAGNOSIS — K2270 Barrett's esophagus without dysplasia: Secondary | ICD-10-CM | POA: Diagnosis not present

## 2024-03-20 DIAGNOSIS — R112 Nausea with vomiting, unspecified: Secondary | ICD-10-CM | POA: Diagnosis not present

## 2024-03-20 DIAGNOSIS — R1013 Epigastric pain: Secondary | ICD-10-CM | POA: Diagnosis not present

## 2024-03-20 DIAGNOSIS — R1084 Generalized abdominal pain: Secondary | ICD-10-CM | POA: Diagnosis not present

## 2024-03-20 DIAGNOSIS — K219 Gastro-esophageal reflux disease without esophagitis: Secondary | ICD-10-CM | POA: Diagnosis not present

## 2024-03-21 DIAGNOSIS — K317 Polyp of stomach and duodenum: Secondary | ICD-10-CM | POA: Diagnosis not present

## 2024-03-21 DIAGNOSIS — K2289 Other specified disease of esophagus: Secondary | ICD-10-CM | POA: Diagnosis not present

## 2024-03-21 DIAGNOSIS — K449 Diaphragmatic hernia without obstruction or gangrene: Secondary | ICD-10-CM | POA: Diagnosis not present

## 2024-03-21 DIAGNOSIS — K219 Gastro-esophageal reflux disease without esophagitis: Secondary | ICD-10-CM | POA: Diagnosis not present

## 2024-03-22 DIAGNOSIS — F332 Major depressive disorder, recurrent severe without psychotic features: Secondary | ICD-10-CM | POA: Diagnosis not present

## 2024-03-27 DIAGNOSIS — Z77120 Contact with and (suspected) exposure to mold (toxic): Secondary | ICD-10-CM | POA: Diagnosis not present

## 2024-03-27 DIAGNOSIS — R059 Cough, unspecified: Secondary | ICD-10-CM | POA: Diagnosis not present

## 2024-03-27 DIAGNOSIS — F419 Anxiety disorder, unspecified: Secondary | ICD-10-CM | POA: Diagnosis not present

## 2024-03-28 DIAGNOSIS — F332 Major depressive disorder, recurrent severe without psychotic features: Secondary | ICD-10-CM | POA: Diagnosis not present

## 2024-04-03 DIAGNOSIS — K219 Gastro-esophageal reflux disease without esophagitis: Secondary | ICD-10-CM | POA: Diagnosis not present

## 2024-04-03 DIAGNOSIS — K760 Fatty (change of) liver, not elsewhere classified: Secondary | ICD-10-CM | POA: Diagnosis not present

## 2024-04-03 DIAGNOSIS — R1013 Epigastric pain: Secondary | ICD-10-CM | POA: Diagnosis not present

## 2024-04-04 DIAGNOSIS — F332 Major depressive disorder, recurrent severe without psychotic features: Secondary | ICD-10-CM | POA: Diagnosis not present

## 2024-04-11 DIAGNOSIS — F332 Major depressive disorder, recurrent severe without psychotic features: Secondary | ICD-10-CM | POA: Diagnosis not present

## 2024-04-18 DIAGNOSIS — F332 Major depressive disorder, recurrent severe without psychotic features: Secondary | ICD-10-CM | POA: Diagnosis not present

## 2024-05-02 DIAGNOSIS — E890 Postprocedural hypothyroidism: Secondary | ICD-10-CM | POA: Diagnosis not present

## 2024-05-02 DIAGNOSIS — E119 Type 2 diabetes mellitus without complications: Secondary | ICD-10-CM | POA: Diagnosis not present

## 2024-05-02 DIAGNOSIS — Z1330 Encounter for screening examination for mental health and behavioral disorders, unspecified: Secondary | ICD-10-CM | POA: Diagnosis not present

## 2024-06-15 DIAGNOSIS — M5412 Radiculopathy, cervical region: Secondary | ICD-10-CM | POA: Diagnosis not present

## 2024-07-08 DIAGNOSIS — R0981 Nasal congestion: Secondary | ICD-10-CM | POA: Diagnosis not present

## 2024-07-08 DIAGNOSIS — R070 Pain in throat: Secondary | ICD-10-CM | POA: Diagnosis not present

## 2024-07-09 DIAGNOSIS — Z1231 Encounter for screening mammogram for malignant neoplasm of breast: Secondary | ICD-10-CM | POA: Diagnosis not present

## 2024-07-10 DIAGNOSIS — R1013 Epigastric pain: Secondary | ICD-10-CM | POA: Diagnosis not present

## 2024-07-10 DIAGNOSIS — K760 Fatty (change of) liver, not elsewhere classified: Secondary | ICD-10-CM | POA: Diagnosis not present

## 2024-07-10 DIAGNOSIS — K219 Gastro-esophageal reflux disease without esophagitis: Secondary | ICD-10-CM | POA: Diagnosis not present
